# Patient Record
Sex: Female | Born: 1980 | Marital: Married
Health system: Midwestern US, Community
[De-identification: ages and names within clinical notes are randomized; demographics above are authoritative.]

## PROBLEM LIST (undated history)

## (undated) DIAGNOSIS — E119 Type 2 diabetes mellitus without complications: Principal | ICD-10-CM

## (undated) DIAGNOSIS — K219 Gastro-esophageal reflux disease without esophagitis: Secondary | ICD-10-CM

## (undated) HISTORY — DX: Type 2 diabetes mellitus without complications: E11.9

---

## 2009-07-07 ENCOUNTER — Emergency Department (HOSPITAL_COMMUNITY): Admission: EM | Admit: 2009-07-07 | Discharge: 2009-07-07 | Payer: Self-pay | Admitting: Family Medicine

## 2009-07-31 LAB — CHG HEMOGLOBIN ELECTROPHORESIS: Hemoglobin, Elp: NORMAL

## 2009-10-22 ENCOUNTER — Ambulatory Visit (HOSPITAL_COMMUNITY): Admission: RE | Admit: 2009-10-22 | Discharge: 2009-10-22 | Payer: Self-pay | Admitting: Family Medicine

## 2009-11-04 ENCOUNTER — Ambulatory Visit (HOSPITAL_COMMUNITY): Admission: RE | Admit: 2009-11-04 | Discharge: 2009-11-04 | Payer: Self-pay | Admitting: Family Medicine

## 2009-12-06 ENCOUNTER — Ambulatory Visit (HOSPITAL_COMMUNITY): Admission: RE | Admit: 2009-12-06 | Discharge: 2009-12-06 | Payer: Self-pay | Admitting: Family Medicine

## 2010-03-23 ENCOUNTER — Ambulatory Visit (HOSPITAL_COMMUNITY): Admission: RE | Admit: 2010-03-23 | Discharge: 2010-03-23 | Payer: Self-pay | Admitting: Family Medicine

## 2010-05-03 ENCOUNTER — Ambulatory Visit: Payer: Self-pay | Admitting: Family Medicine

## 2010-05-04 ENCOUNTER — Inpatient Hospital Stay (HOSPITAL_COMMUNITY): Admission: AD | Admit: 2010-05-04 | Discharge: 2010-05-04 | Payer: Self-pay | Admitting: Obstetrics & Gynecology

## 2010-05-08 ENCOUNTER — Inpatient Hospital Stay (HOSPITAL_COMMUNITY): Admission: AD | Admit: 2010-05-08 | Discharge: 2010-05-12 | Payer: Self-pay | Admitting: Family Medicine

## 2010-05-08 ENCOUNTER — Ambulatory Visit: Payer: Self-pay | Admitting: Obstetrics & Gynecology

## 2010-05-09 ENCOUNTER — Encounter: Payer: Self-pay | Admitting: Family Medicine

## 2010-08-28 ENCOUNTER — Emergency Department (HOSPITAL_COMMUNITY)
Admission: EM | Admit: 2010-08-28 | Discharge: 2010-08-28 | Payer: Self-pay | Source: Home / Self Care | Admitting: Emergency Medicine

## 2010-08-28 ENCOUNTER — Emergency Department (HOSPITAL_COMMUNITY)
Admission: EM | Admit: 2010-08-28 | Discharge: 2010-08-28 | Payer: Self-pay | Source: Home / Self Care | Admitting: Family Medicine

## 2010-11-04 ENCOUNTER — Emergency Department (HOSPITAL_COMMUNITY)
Admission: EM | Admit: 2010-11-04 | Discharge: 2010-11-04 | Payer: Self-pay | Source: Home / Self Care | Admitting: Family Medicine

## 2010-11-06 HISTORY — PX: CHOLECYSTECTOMY: SHX55

## 2010-12-08 ENCOUNTER — Emergency Department (HOSPITAL_COMMUNITY)
Admission: EM | Admit: 2010-12-08 | Discharge: 2010-12-08 | Disposition: A | Discharge status: Home / Self Care | Payer: Medicaid Other | Attending: Emergency Medicine | Admitting: Emergency Medicine

## 2010-12-08 DIAGNOSIS — H53149 Visual discomfort, unspecified: Secondary | ICD-10-CM | POA: Insufficient documentation

## 2010-12-08 DIAGNOSIS — R51 Headache: Secondary | ICD-10-CM | POA: Insufficient documentation

## 2010-12-21 IMAGING — US US OB COMP +14 WK
2 series · 14 of 28 positions shown · non-contrast
Comparison: none

OBSTETRICAL ULTRASOUND:
 This ultrasound exam was performed in the [HOSPITAL] Ultrasound Department.  The OB US report was generated in the AS system, and faxed to the ordering physician.  This report is also available in [HOSPITAL]?s AccessANYware and in [REDACTED] PACS.

[Series 1: us ob comp +14 wk · 14 acquisitions, 2 frames shown (1 of 2)]
[im 5/14]
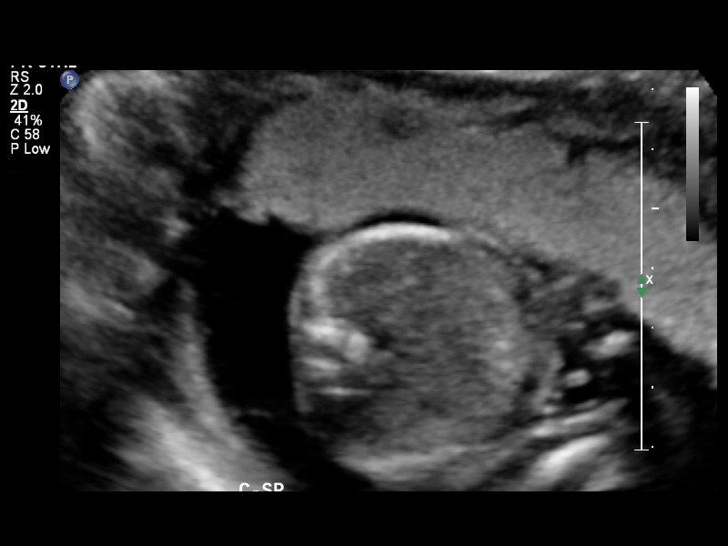
[im 14/14]
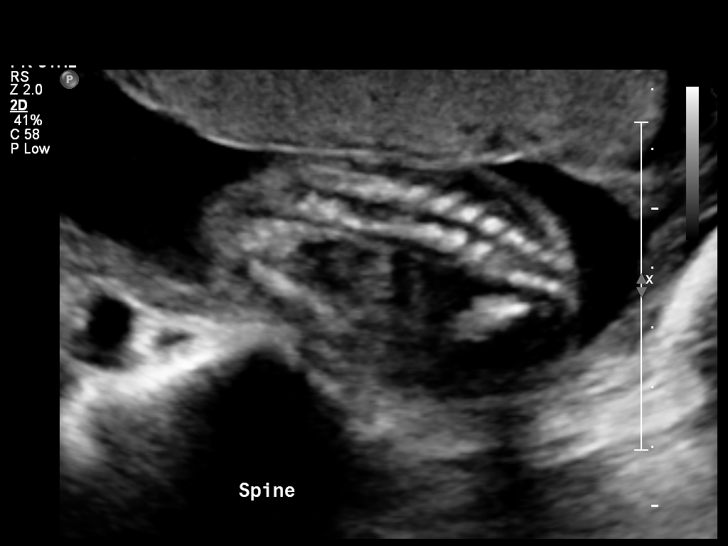

[Series 1: us ob comp +14 wk · 12 of 83 slices shown (2 of 2)]
[im 4/83]
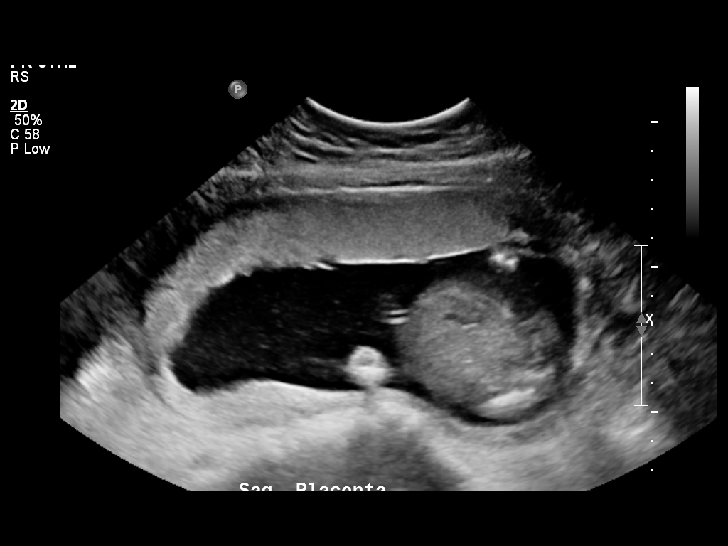
[im 11/83]
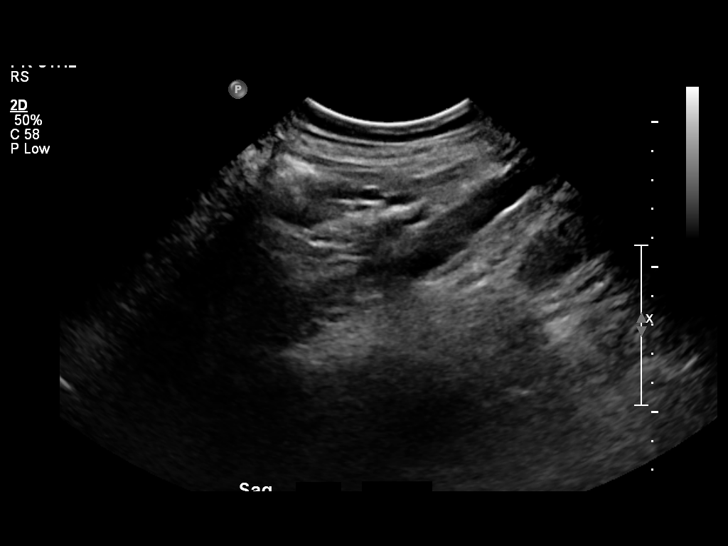
[im 18/83]
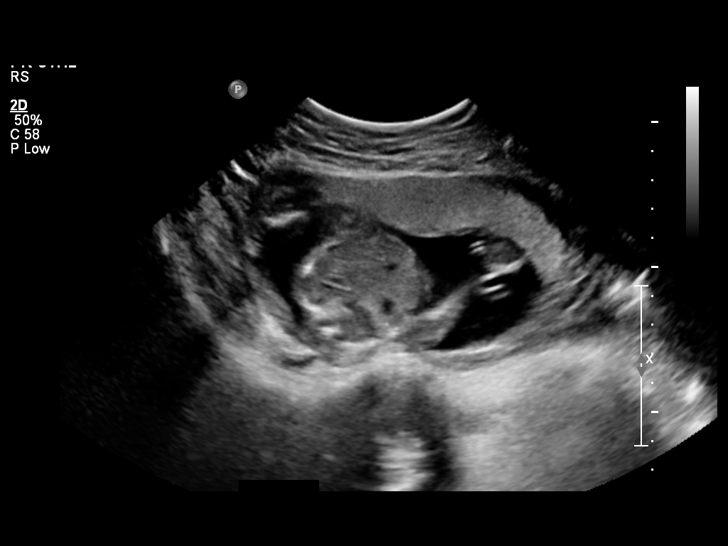
[im 25/83]
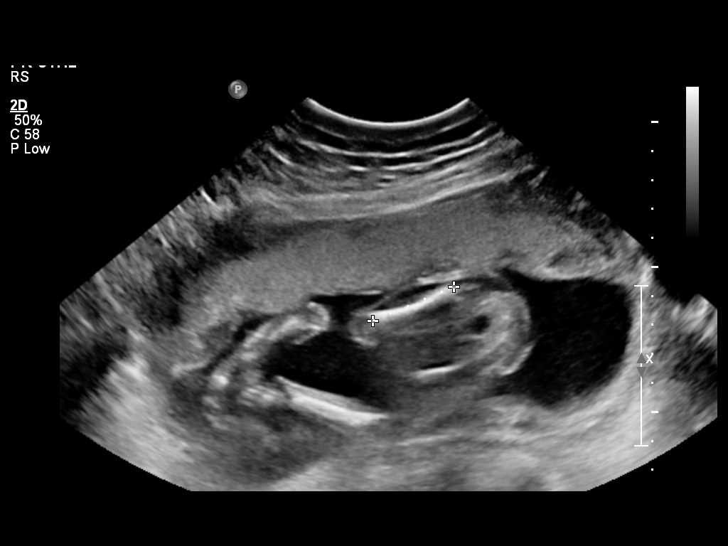
[im 33/83]
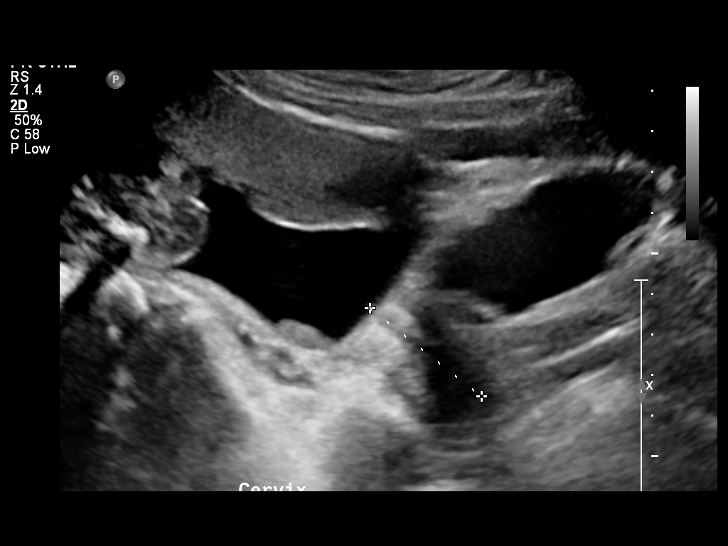
[im 40/83]
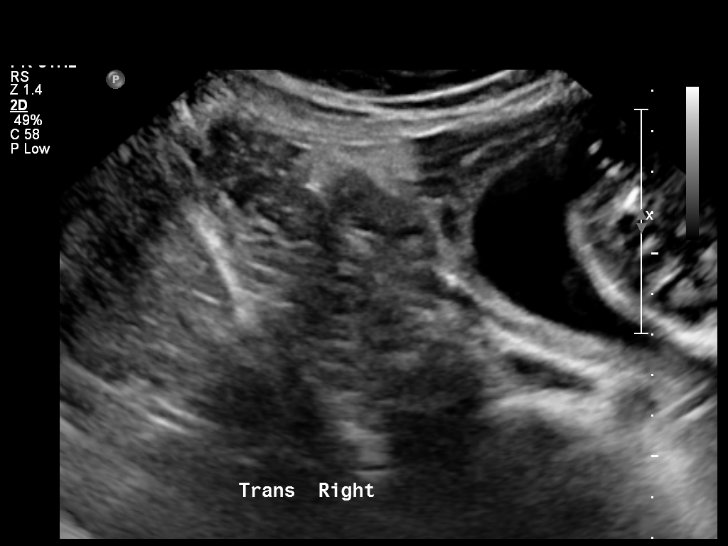
[im 47/83]
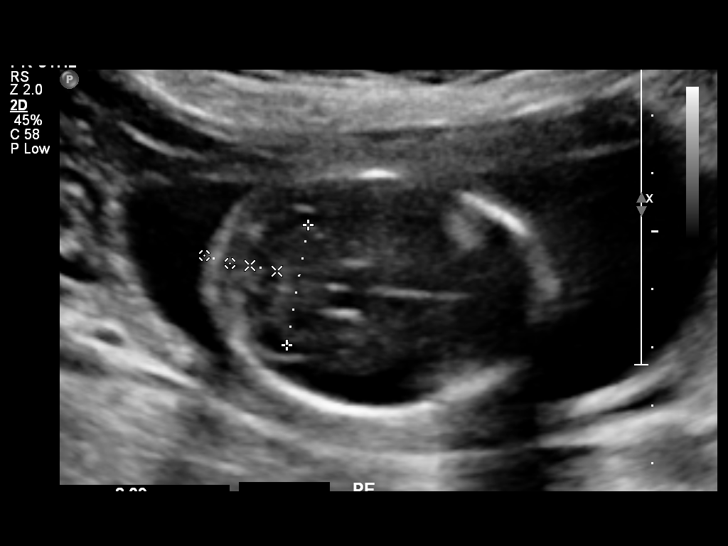
[im 54/83]
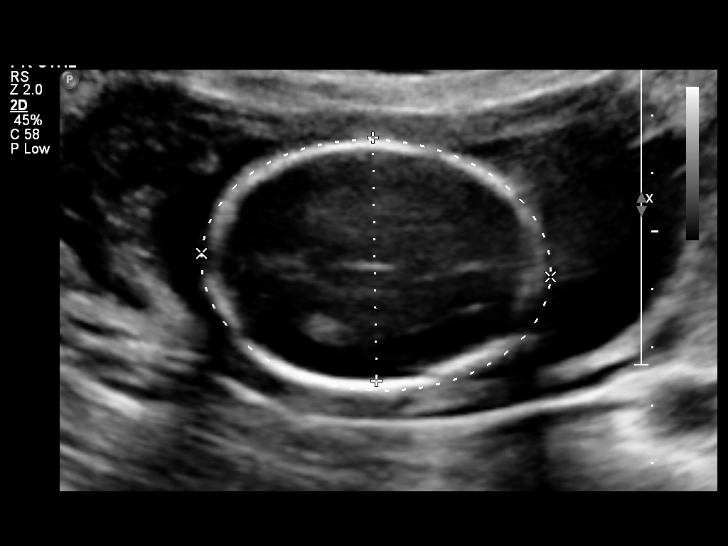
[im 61/83]
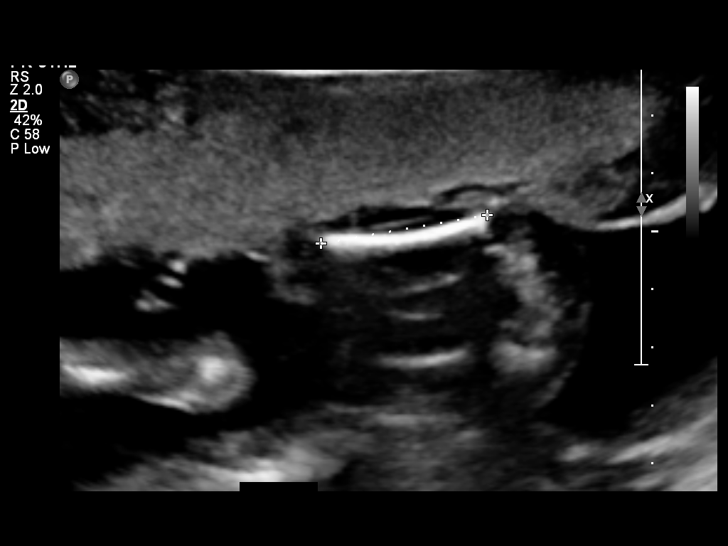
[im 68/83]
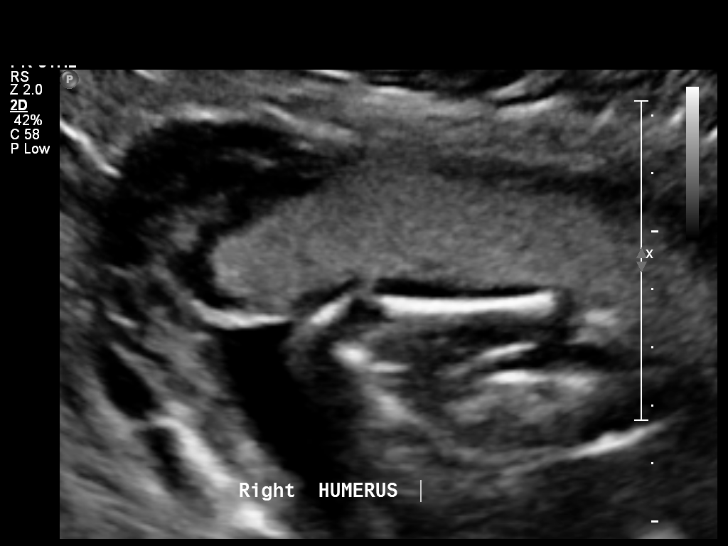
[im 75/83]
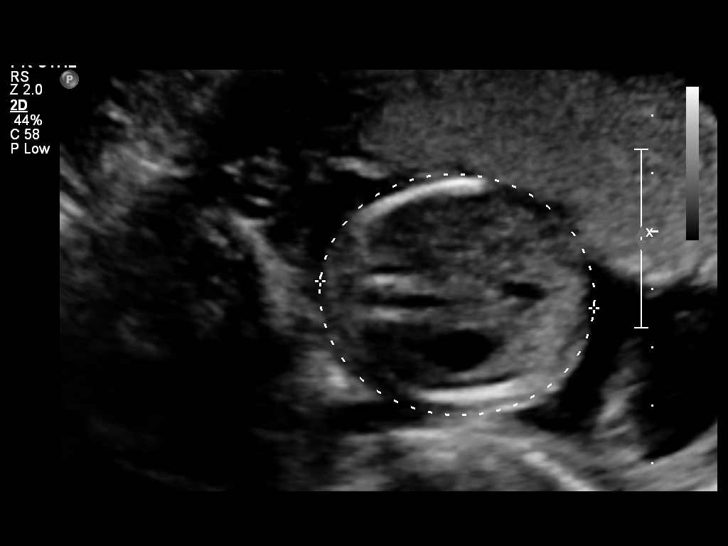
[im 83/83]
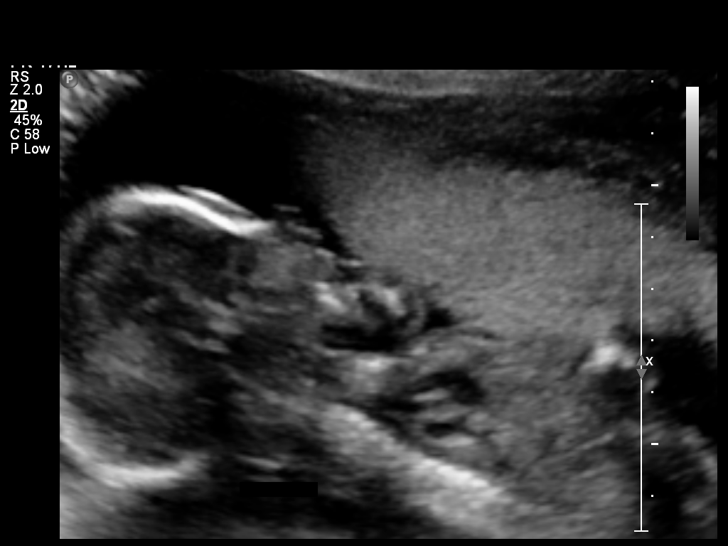

[14 of 28 positions shown; findings below may reference images not displayed]

IMPRESSION: See AS Obstetric US report.

## 2011-01-05 ENCOUNTER — Encounter (HOSPITAL_COMMUNITY)
Admission: RE | Admit: 2011-01-05 | Discharge: 2011-01-05 | Disposition: A | Discharge status: Home / Self Care | Payer: Medicaid Other | Source: Ambulatory Visit | Attending: Surgery | Admitting: Surgery

## 2011-01-05 DIAGNOSIS — Z01812 Encounter for preprocedural laboratory examination: Secondary | ICD-10-CM | POA: Insufficient documentation

## 2011-01-05 LAB — CBC
MCHC: 32.5 g/dL (ref 30.0–36.0)
RDW: 14.1 % (ref 11.5–15.5)

## 2011-01-05 LAB — HCG, SERUM, QUALITATIVE: Preg, Serum: NEGATIVE

## 2011-01-05 LAB — SURGICAL PCR SCREEN: Staphylococcus aureus: POSITIVE — AB

## 2011-01-09 ENCOUNTER — Ambulatory Visit (HOSPITAL_COMMUNITY): Payer: Medicaid Other

## 2011-01-09 ENCOUNTER — Other Ambulatory Visit: Payer: Self-pay | Admitting: Surgery

## 2011-01-09 ENCOUNTER — Ambulatory Visit (HOSPITAL_COMMUNITY)
Admission: RE | Admit: 2011-01-09 | Discharge: 2011-01-09 | Disposition: A | Discharge status: Home / Self Care | Payer: Medicaid Other | Source: Ambulatory Visit | Attending: Surgery | Admitting: Surgery

## 2011-01-09 DIAGNOSIS — K8010 Calculus of gallbladder with chronic cholecystitis without obstruction: Secondary | ICD-10-CM | POA: Insufficient documentation

## 2011-01-13 NOTE — Op Note (Signed)
NAMEABRISH, ERNY NO.:  [id number]  MEDICAL RECORD NO.:  [id number]           PATIENT TYPE:  O  LOCATION:  [facility]                         FACILITY:  [facility]  PHYSICIAN:  Wilmon Arms. Corliss Skains, M.D. DATE OF BIRTH:  February 06, 1981  DATE OF PROCEDURE:  01/09/2011 DATE OF DISCHARGE:  01/09/2011                              OPERATIVE REPORT   PREOPERATIVE DIAGNOSIS:  Chronic calculus cholecystitis.  POSTOPERATIVE DIAGNOSIS:  Chronic calculus cholecystitis.  PROCEDURE:  Laparoscopic cholecystectomy with intraoperative cholangiogram.  SURGEON:  Wilmon Arms. Corliss Skains, MD  ANESTHESIA:  General.  INDICATIONS:  This is a 30 year old female who presents with several months of intermittent right upper quadrant pain.  She had an ultrasound showing multiple gallstones.  She presents now for cholecystectomy.  DESCRIPTION OF PROCEDURE:  The patient was brought to the operating room, placed in supine position on the operating room table.  After an adequate level of general anesthesia was obtained, the patient's abdomen was prepped with ChloraPrep and draped in sterile fashion.  Time-out was taken to assure the proper patient, proper procedure.  We infiltrated the area below the umbilicus with 0.25% Marcaine with epinephrine.  A transverse incision was made.  Dissection was carried down to the fascia.  The fascia was opened vertically.  We entered the peritoneal cavity bluntly.  A stay suture of 0 Vicryl was placed around the fascial opening.  The Hasson cannula was inserted, secured to stay suture. Pneumoperitoneum was obtained by insufflating CO2, maintaining maximum pressure of 15 mmHg.  The laparoscope was inserted, and the patient was positioned in reverse Trendelenburg, tilted to her left.  An 11-mm port was placed in the subxiphoid position.  Two 5-mm ports were placed in right upper quadrant.  The gallbladder was grasped with clamp and lifted.  There were minimal adhesions to  the gallbladder.  We opened the peritoneum around the hilum of the gallbladder and bluntly dissected the cystic duct.  I could visualize the cystic artery and a large lymph node on the cystic artery.  The cystic duct was ligated with a clip distally. A small opening was created on the cystic duct.  A Cook cholangiogram catheter was inserted through a stab incision, threaded in the cystic duct.  A cholangiogram was then obtained which showed good flow proximally and distally to the biliary tree with no sign of filling defect.  Contrast flowed easily into the duodenum.  The catheter was removed and the cystic duct was ligated with clips and divided.  The cystic artery was ligated with clips and divided.  The gallbladder was dissected free from the liver using cautery.  The gallbladder fossa was thoroughly inspected for hemostasis.  A little bit of bile had been spilled but no stones were noted.  We irrigated thoroughly and suctioned out the irrigation.  The gallbladder was placed in the Endocatch sac and removed through the umbilical port site.  We again thoroughly irrigated. The umbilical fascia was closed with the pursestring suture. Pneumoperitoneum was released and removed the trocars.  A 4-0 Monocryl was used to close the skin incisions.  Steri-Strips and clean dressings were applied.  The patient was extubated  and brought to recovery in stable condition.  All sponge, instrument, needle counts correct.     Wilmon Arms. Corliss Skains, M.D.     MKT/MEDQ  D:  01/09/2011  T:  01/09/2011  Job:  [id number]  Electronically Signed by Manus Rudd M.D. on 01/13/2011 10:18:10 PM

## 2011-01-18 LAB — CBC
HCT: 42.4 % (ref 36.0–46.0)
MCHC: 33 g/dL (ref 30.0–36.0)
Platelets: 244 10*3/uL (ref 150–400)
RDW: 13.8 % (ref 11.5–15.5)
WBC: 9.6 10*3/uL (ref 4.0–10.5)

## 2011-01-18 LAB — POCT URINALYSIS DIPSTICK
Ketones, ur: NEGATIVE mg/dL
Nitrite: NEGATIVE
Protein, ur: NEGATIVE mg/dL
pH: 5 (ref 5.0–8.0)

## 2011-01-18 LAB — COMPREHENSIVE METABOLIC PANEL
Alkaline Phosphatase: 107 U/L (ref 39–117)
BUN: 10 mg/dL (ref 6–23)
CO2: 22 mEq/L (ref 19–32)
GFR calc non Af Amer: >60 mL/min (ref >60)
Glucose, Bld: 92 mg/dL (ref 70–99)
Potassium: 3.7 mEq/L (ref 3.5–5.1)
Total Protein: 8.4 g/dL — ABNORMAL HIGH (ref 6.0–8.3)

## 2011-01-18 LAB — LIPASE, BLOOD: Lipase: 29 U/L (ref 11–59)

## 2011-01-18 LAB — DIFFERENTIAL
Basophils Absolute: 0 10*3/uL (ref 0.0–0.1)
Basophils Relative: 0 % (ref 0–1)
Lymphocytes Relative: 29 % (ref 12–46)
Neutro Abs: 6.2 10*3/uL (ref 1.7–7.7)
Neutrophils Relative %: 65 % (ref 43–77)

## 2011-01-22 LAB — CBC
HCT: 32.2 % — ABNORMAL LOW (ref 36.0–46.0)
HCT: 36.3 % (ref 36.0–46.0)
Hemoglobin: 11.8 g/dL — ABNORMAL LOW (ref 12.0–15.0)
MCV: 81.7 fL (ref 78.0–100.0)
RBC: 3.94 MIL/uL (ref 3.87–5.11)
RBC: 4.52 MIL/uL (ref 3.87–5.11)
RDW: 14.6 % (ref 11.5–15.5)
WBC: 14.4 10*3/uL — ABNORMAL HIGH (ref 4.0–10.5)
WBC: 7.6 10*3/uL (ref 4.0–10.5)

## 2011-01-22 LAB — RPR: RPR Ser Ql: NONREACTIVE

## 2011-02-10 LAB — POCT PREGNANCY, URINE: Preg Test, Ur: NEGATIVE

## 2011-02-10 LAB — POCT URINALYSIS DIP (DEVICE)
Bilirubin Urine: NEGATIVE
Ketones, ur: NEGATIVE mg/dL
Protein, ur: NEGATIVE mg/dL
Specific Gravity, Urine: >=1.03 (ref 1.005–1.030)
pH: 5 (ref 5.0–8.0)

## 2011-06-30 LAB — CYTOLOGY - PAP: Pap Smear: NEGATIVE

## 2012-01-24 IMAGING — RF DG CHOLANGIOGRAM OPERATIVE
1 series · 4 of 4 positions shown · non-contrast
Comparison: Abdominal ultrasound dated 08/28/2010

CLINICAL DATA: Cholecystitis.

INTRAOPERATIVE CHOLANGIOGRAM
TECHNIQUE: Cholangiographic images from the C-arm fluoroscopic
device were submitted for interpretation post-operatively.  Please
see the procedural report for the amount of contrast and the
fluoroscopy time utilized.

[Series 1: run · 4 of 41 frames shown]
[frame 2/41]
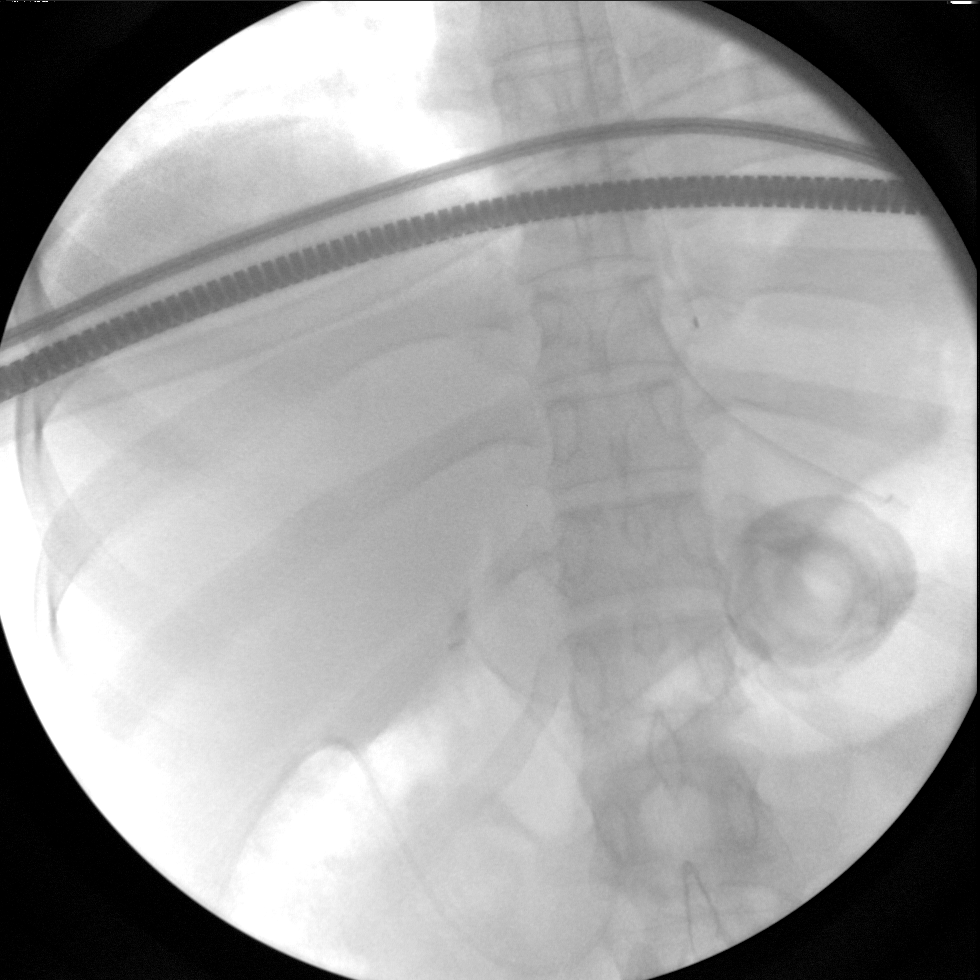
[frame 7/41]
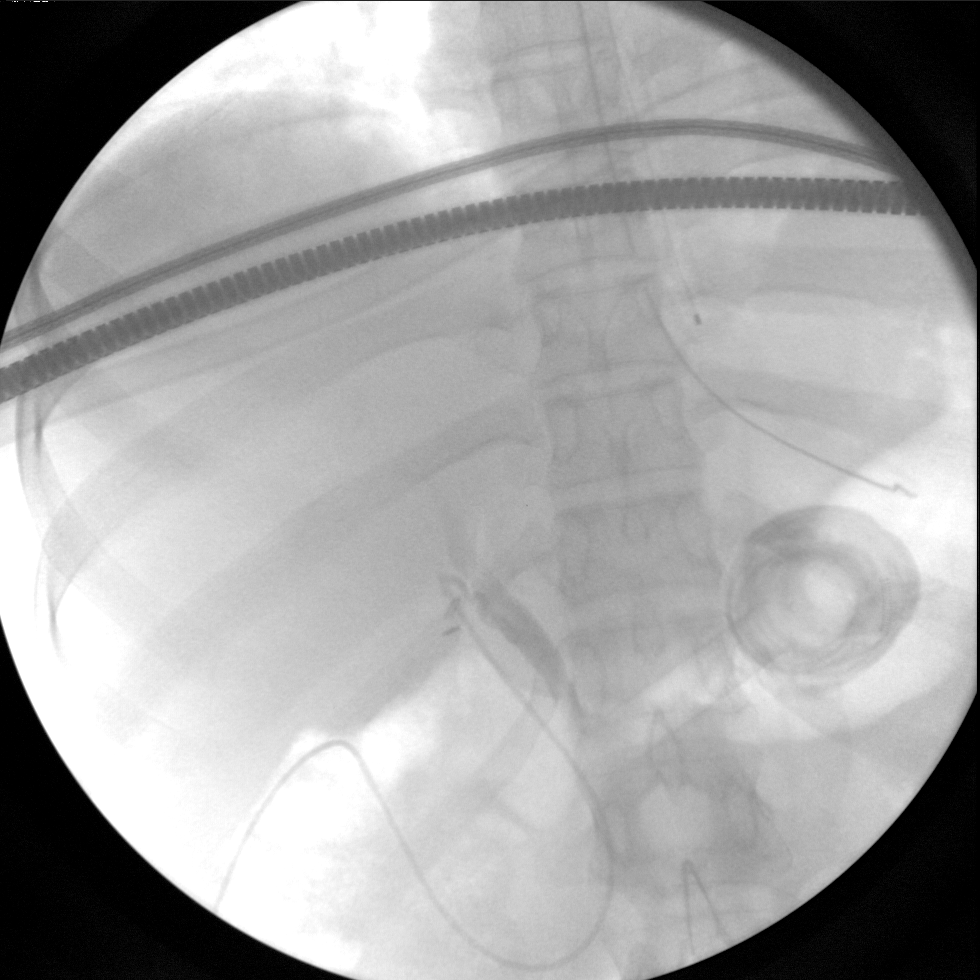
[frame 21/41]
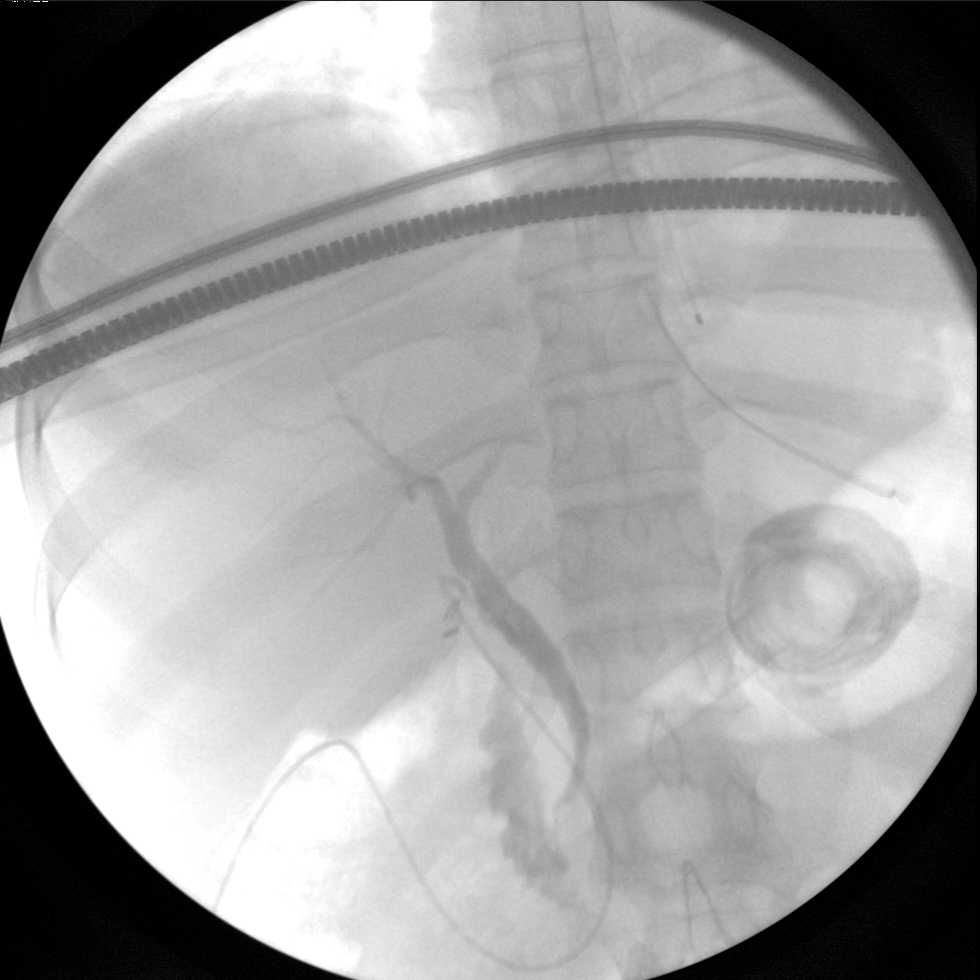
[frame 35/41]
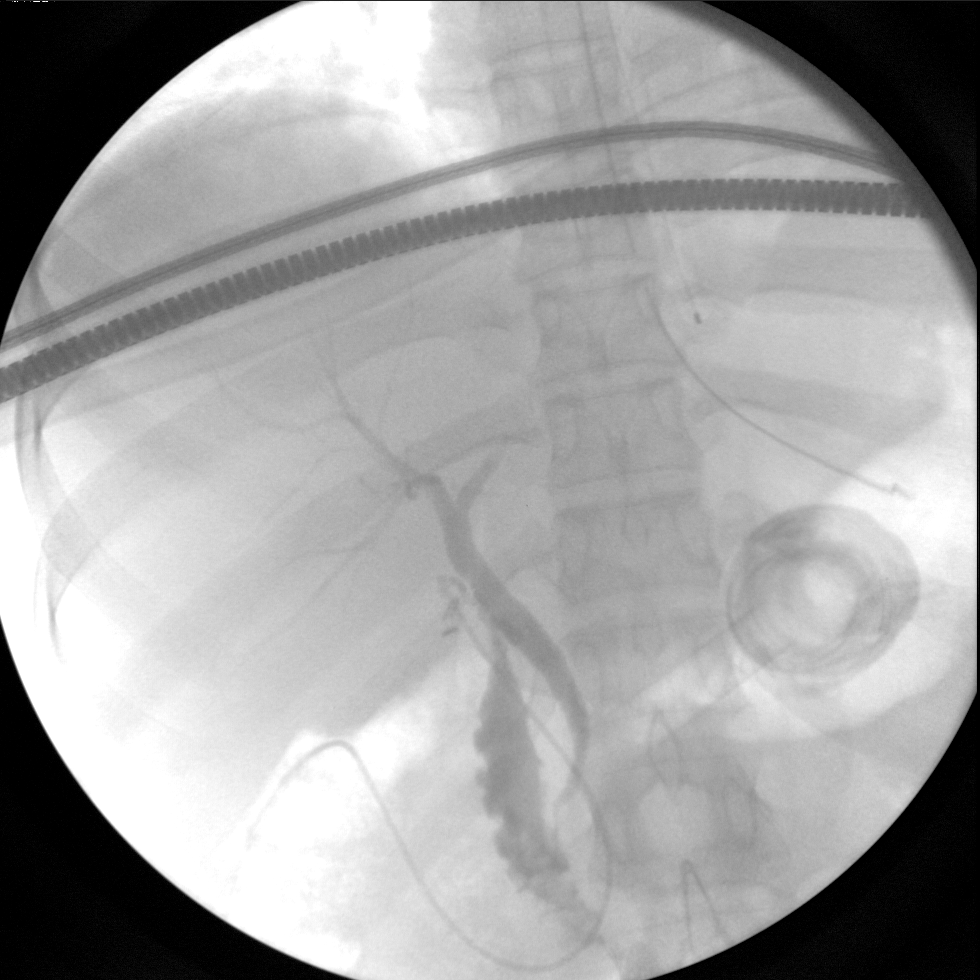

[4 of 4 positions shown; findings below may reference images not displayed]

FINDINGS: There is a nasogastric tube in the stomach body region.
There is an additional tube near the GE junction.  Cannulization of
the cystic duct.  Opacification of the common bile duct with reflux
into the intrahepatic biliary system.  Contrast drains into the
duodenum.  There may be a small amount of sludge near the cystic
duct but no evidence for obstructing stones.
IMPRESSION: Patent biliary system.  No evidence for large stones.  Difficult to
exclude nonobstructive sludge.

## 2014-03-22 ENCOUNTER — Emergency Department (HOSPITAL_COMMUNITY)
Admission: EM | Admit: 2014-03-22 | Discharge: 2014-03-22 | Discharge status: Absent without leave | Payer: Self-pay | Source: Home / Self Care

## 2014-05-07 ENCOUNTER — Other Ambulatory Visit (HOSPITAL_COMMUNITY): Payer: Self-pay | Admitting: Physician Assistant

## 2014-05-07 DIAGNOSIS — Z3682 Encounter for antenatal screening for nuchal translucency: Secondary | ICD-10-CM

## 2014-05-07 LAB — OB RESULTS CONSOLE HGB/HCT, BLOOD
HCT: 38 %
HEMOGLOBIN: 12.3 g/dL
HEMOGLOBIN: 12.3 g/dL
HEMOGLOBIN: 12.3 g/dL

## 2014-05-07 LAB — OB RESULTS CONSOLE PLATELET COUNT: Platelets: 239 10*3/uL

## 2014-05-07 LAB — OB RESULTS CONSOLE ABO/RH: RH TYPE: POSITIVE

## 2014-05-07 LAB — GLUCOSE TOLERANCE, 1 HOUR
Glucose, 1 Hour GTT: 171
Glucose, GTT - 1 Hour: 171 mg/dL (ref ?–200)

## 2014-05-07 LAB — CULTURE, OB URINE: Urine Culture, OB: NEGATIVE

## 2014-05-07 LAB — OB RESULTS CONSOLE ANTIBODY SCREEN: Antibody Screen: NEGATIVE

## 2014-05-11 LAB — OB RESULTS CONSOLE HEPATITIS B SURFACE ANTIGEN: HEP B S AG: NEGATIVE

## 2014-05-11 LAB — GLUCOSE TOLERANCE, 3 HOURS
GLUCOSE FASTING: 117 mg/dL — AB (ref 60–109)
GLUCOSE: 259
Glucose: 215
Glucose: 265

## 2014-05-11 LAB — GLUCOSE TOLERANCE, 1 HOUR
GLUCOSE 1 HOUR GTT: 259 mg/dL — AB (ref ?–200)
Glucose, Fasting: 117 mg/dL — AB (ref 60–109)
Glucose, GTT - 2 Hour: 265 mg/dL — AB (ref ?–140)
Glucose, GTT - 3 Hour: 215 mg/dL — AB (ref ?–140)

## 2014-05-11 LAB — OB RESULTS CONSOLE RPR: RPR: NONREACTIVE

## 2014-05-11 LAB — OB RESULTS CONSOLE HIV ANTIBODY (ROUTINE TESTING): HIV: NONREACTIVE

## 2014-05-11 LAB — OB RESULTS CONSOLE RUBELLA ANTIBODY, IGM: RUBELLA: IMMUNE

## 2014-05-13 DIAGNOSIS — O34219 Maternal care for unspecified type scar from previous cesarean delivery: Secondary | ICD-10-CM | POA: Insufficient documentation

## 2014-05-13 DIAGNOSIS — O24419 Gestational diabetes mellitus in pregnancy, unspecified control: Secondary | ICD-10-CM | POA: Insufficient documentation

## 2014-05-13 NOTE — Progress Notes (Signed)
Transfer from [facility]. Unsure LMP 02/11/14

## 2014-05-14 ENCOUNTER — Encounter: Payer: Self-pay | Admitting: *Deleted

## 2014-05-18 ENCOUNTER — Other Ambulatory Visit: Payer: Self-pay

## 2014-05-18 ENCOUNTER — Ambulatory Visit (HOSPITAL_COMMUNITY)
Admission: RE | Admit: 2014-05-18 | Discharge: 2014-05-18 | Disposition: A | Discharge status: Home / Self Care | Payer: BC Managed Care – PPO | Source: Ambulatory Visit | Attending: Physician Assistant | Admitting: Physician Assistant

## 2014-05-18 ENCOUNTER — Ambulatory Visit (INDEPENDENT_AMBULATORY_CARE_PROVIDER_SITE_OTHER): Payer: BC Managed Care – PPO | Admitting: Obstetrics & Gynecology

## 2014-05-18 ENCOUNTER — Encounter: Payer: BC Managed Care – PPO | Attending: Obstetrics & Gynecology | Admitting: *Deleted

## 2014-05-18 ENCOUNTER — Ambulatory Visit (HOSPITAL_COMMUNITY)
Admission: RE | Admit: 2014-05-18 | Discharge: 2014-05-18 | Disposition: A | Discharge status: Home / Self Care | Payer: BC Managed Care – PPO | Source: Ambulatory Visit | Attending: Surgery | Admitting: Surgery

## 2014-05-18 VITALS — Ht 59.0 in | Wt 158.7 lb

## 2014-05-18 DIAGNOSIS — O99810 Abnormal glucose complicating pregnancy: Secondary | ICD-10-CM | POA: Diagnosis present

## 2014-05-18 DIAGNOSIS — Z3682 Encounter for antenatal screening for nuchal translucency: Secondary | ICD-10-CM

## 2014-05-18 DIAGNOSIS — Z36 Encounter for antenatal screening of mother: Secondary | ICD-10-CM | POA: Diagnosis present

## 2014-05-18 DIAGNOSIS — O34219 Maternal care for unspecified type scar from previous cesarean delivery: Secondary | ICD-10-CM

## 2014-05-18 NOTE — Progress Notes (Signed)
  Patient was seen on 05/18/14 for Gestational Diabetes self-management . The following learning objectives were met by the patient :   States the definition of Gestational Diabetes  States when to check blood glucose levels  Demonstrates proper blood glucose monitoring techniques  States the effect of stress and exercise on blood glucose levels  Plan:  Consider  increasing your activity level by walking daily as tolerated Begin checking BG before breakfast and-2 hours after first bit of breakfast, lunch and dinner after  as directed by MD   Blood glucose monitor given: TrueTrack Lot # [id number]  Exp: 2016/03/19 Blood glucose reading: $RemoveBeforeDE'190mg'haPzNiDsPZJzjQA$ /dl  Patient instructed to monitor glucose levels: FBS: 60 - <90 2 hour: <120  Patient received the following handouts:  Nutrition Diabetes and Pregnancy  Patient will be seen for follow-up as needed.

## 2014-05-18 NOTE — Progress Notes (Signed)
Patient was seen today for GDM diet education.  Obese woman pregravid.  7.7# gain at 8096w5d is above recommendations.  Pt given written and verbal GDM education.  She reports eating 3 meals/day, no snacks.  NKFA.  No N/V reported.  Taking PNV.  No meds.  Discussed wt gain goal of 11-20#, or 1/2# per week for remainder of pregnancy, and incorporating physical activity into daily routine.  Pt agrees to follow GDM diet including 3 meals + 3 snacks/day and proper carbohydrate/protein combination.  [facility]  Follow up in 4-6 weeks.  Melanee LeftErin Cashwell, MPH RD LDN

## 2014-05-25 ENCOUNTER — Encounter: Payer: Self-pay | Admitting: Obstetrics & Gynecology

## 2014-05-25 ENCOUNTER — Ambulatory Visit (INDEPENDENT_AMBULATORY_CARE_PROVIDER_SITE_OTHER): Payer: BC Managed Care – PPO | Admitting: Obstetrics & Gynecology

## 2014-05-25 VITALS — BP 95/56 | HR 87 | Temp 98.3°F | Wt 159.1 lb

## 2014-05-25 DIAGNOSIS — O24414 Gestational diabetes mellitus in pregnancy, insulin controlled: Secondary | ICD-10-CM

## 2014-05-25 DIAGNOSIS — O99810 Abnormal glucose complicating pregnancy: Secondary | ICD-10-CM

## 2014-05-25 DIAGNOSIS — O34219 Maternal care for unspecified type scar from previous cesarean delivery: Secondary | ICD-10-CM

## 2014-05-25 DIAGNOSIS — O24912 Unspecified diabetes mellitus in pregnancy, second trimester: Secondary | ICD-10-CM

## 2014-05-25 DIAGNOSIS — O0992 Supervision of high risk pregnancy, unspecified, second trimester: Secondary | ICD-10-CM

## 2014-05-25 DIAGNOSIS — E119 Type 2 diabetes mellitus without complications: Secondary | ICD-10-CM

## 2014-05-25 DIAGNOSIS — O24919 Unspecified diabetes mellitus in pregnancy, unspecified trimester: Secondary | ICD-10-CM

## 2014-05-25 DIAGNOSIS — O0990 Supervision of high risk pregnancy, unspecified, unspecified trimester: Secondary | ICD-10-CM

## 2014-05-25 LAB — POCT URINALYSIS DIP (DEVICE)
Bilirubin Urine: NEGATIVE
Glucose, UA: NEGATIVE mg/dL
HGB URINE DIPSTICK: NEGATIVE
Ketones, ur: NEGATIVE mg/dL
Leukocytes, UA: NEGATIVE
NITRITE: NEGATIVE
PROTEIN: NEGATIVE mg/dL
Specific Gravity, Urine: 1.01 (ref 1.005–1.030)
UROBILINOGEN UA: 0.2 mg/dL (ref 0.0–1.0)
pH: 6.5 (ref 5.0–8.0)

## 2014-05-25 MED ORDER — GLYBURIDE 2.5 MG PO TABS: 2.5000 mg | ORAL_TABLET | Freq: Two times a day (BID) | ORAL | Status: DC

## 2014-05-25 NOTE — Progress Notes (Signed)
New OB packet given

## 2014-05-25 NOTE — Progress Notes (Signed)
Fastings:  115-138;  Pp break 128-220;  Pp lunch 137-216;  Pp dinner 165-200.  Pt states she is following GDM diet.  Start glyburide 2.5 mg bid    Will have her met with nutritionist again.  Nuchal 3.0.  Pt got panorama.  Needs AFP at appropriate gestational age.  Needs fetal echo and baseline labs.  Need records from health dept.

## 2014-05-25 NOTE — Progress Notes (Signed)
Nutrition note: GDM diet review Pt has gained 4.1# @ 7854w5d, which is wnl. Pt lost a little wt since last appt but pt also made some dietary changes such as cutting out soda & decreasing portion sizes, which could have caused wt loss. Pt has been checking her BS- fasting: 114-138; 2hr pp: [phone]. Per Dr Penne LashLeggett- planning to start meds to help control BS. Pt reports eating 3 meals & 1-2 snacks/d. Pt is taking PNV. Pt reports walking 1hr/d (30 mins in am & 30 mins in pm). Reviewed GDM diet & provided DM My Plate handout. Discussed portion sizes & encouraged pt to buy some measuring cups. Also discussed food labels.  Discussed wt gain goals of 11-20# or 0.5#/wk. Pt agrees to monitor her portion sizes. F/u in 4-6 wks Elizabeth RevealLaura Kory Rains, MS, RD, LDN, [facility]

## 2014-05-26 ENCOUNTER — Telehealth: Payer: Self-pay

## 2014-05-26 ENCOUNTER — Other Ambulatory Visit: Payer: BC Managed Care – PPO

## 2014-05-26 DIAGNOSIS — R8290 Unspecified abnormal findings in urine: Secondary | ICD-10-CM

## 2014-05-26 LAB — COMPREHENSIVE METABOLIC PANEL
ALBUMIN: 3.4 g/dL — AB (ref 3.5–5.2)
ALK PHOS: 48 U/L (ref 39–117)
ALT: 9 U/L (ref 0–35)
AST: 11 U/L (ref 0–37)
BILIRUBIN TOTAL: 0.4 mg/dL (ref 0.2–1.2)
BUN: 5 mg/dL — AB (ref 6–23)
CO2: 21 mEq/L (ref 19–32)
CREATININE: 0.52 mg/dL (ref 0.50–1.10)
Calcium: 8.8 mg/dL (ref 8.4–10.5)
Chloride: 103 mEq/L (ref 96–112)
GLUCOSE: 138 mg/dL — AB (ref 70–99)
POTASSIUM: 4 meq/L (ref 3.5–5.3)
Sodium: 135 mEq/L (ref 135–145)
Total Protein: 6.5 g/dL (ref 6.0–8.3)

## 2014-05-26 NOTE — Telephone Encounter (Addendum)
[facility] and requested she fax over H&P and all lab work-- she stated she would work on it this afternoon. Called [facility] and spoke to WaterproofAngela, who states nuchal U/S was read, however, since the beginning of the month U/S reports will not be scanned in automatically-- they will be faxed to office and need to be scanned in via our staff.   Update: [facility]ed and to be scanned in. Looked through records and did not see U/S results to scan in. Called [facility] again-- Marylene Landngela to have sonographer retrieve U/S report and fax it to clinic.

## 2014-05-26 NOTE — Telephone Encounter (Signed)
Message copied by Louanna RawAMPBELL, Keiry Kowal M on Tue May 26, 2014  3:02 PM ------      Message from: Odelia GageLINTON, CHERYL A      Created: Mon May 25, 2014  3:55 PM                   ----- Message -----         From: Lesly DukesKelly H Leggett, MD         Sent: 05/25/2014   9:19 AM           To: [facility] Admin Pool            Please get records from health dept with H & P and rest of labs.      Please call [facility] and have them read the Nuchal US. ------

## 2014-05-26 NOTE — Telephone Encounter (Signed)
Received nuchal U/S from [facility]. Will be scanned in.

## 2014-05-27 ENCOUNTER — Encounter: Payer: Self-pay | Admitting: *Deleted

## 2014-05-27 LAB — TSH: TSH: 1.509 u[IU]/mL (ref 0.350–4.500)

## 2014-05-28 ENCOUNTER — Telehealth (HOSPITAL_COMMUNITY): Payer: Self-pay | Admitting: MS"

## 2014-05-28 NOTE — Telephone Encounter (Signed)
Called Elizabeth West to discuss her cell free fetal DNA test results.  Mrs. Elizabeth West had Panorama testing through [facility].  Testing was offered because of increased nuchal translucency on ultrasound.   The patient was identified by name and DOB.  We reviewed that these are within normal limits, showing a less than 1 in 10,000 risk for trisomies 21, 18 and 13, and monosomy X (Turner syndrome).  In addition, the risk for triploidy/vanishing twin and sex chromosome trisomies (47,XXX and 47,XXY) was also low risk.  We reviewed that this testing identifies > 99% of pregnancies with trisomy 6321, trisomy 4113, sex chromosome trisomies (47,XXX and 47,XXY), and triploidy. The detection rate for trisomy 18 is 96%.  The detection rate for monosomy X is ~92%.  The false positive rate is <0.1% for all conditions. Testing was also consistent with female fetal sex.  The patient did wish to know fetal sex.  This testing does not identify all genetic conditions.   Detailed ultrasound and fetal echocardiogram are available to the patient in the second trimester given the increased nuchal translucency finding.  All questions were answered to her satisfaction, she was encouraged to call with additional questions or concerns.  Quinn PlowmanKaren Jaymar Loeber, MS Patent attorneyCertified Genetic Counselor

## 2014-05-29 LAB — PROTEIN, URINE, 24 HOUR: Protein, Urine: <3 mg/dL

## 2014-05-29 LAB — CREATININE CLEARANCE, URINE, 24 HOUR
CREAT CLEAR: 170 mL/min — AB (ref 75–115)
Creatinine, 24H Ur: 1272 mg/d (ref 700–1800)
Creatinine, Urine: 42.8 mg/dL
Creatinine: 0.52 mg/dL (ref 0.50–1.10)

## 2014-06-01 ENCOUNTER — Ambulatory Visit (INDEPENDENT_AMBULATORY_CARE_PROVIDER_SITE_OTHER): Payer: BC Managed Care – PPO | Admitting: Family Medicine

## 2014-06-01 ENCOUNTER — Encounter: Payer: Self-pay | Admitting: Family Medicine

## 2014-06-01 VITALS — BP 108/62 | HR 78 | Temp 98.2°F | Wt 158.9 lb

## 2014-06-01 DIAGNOSIS — O0992 Supervision of high risk pregnancy, unspecified, second trimester: Secondary | ICD-10-CM

## 2014-06-01 DIAGNOSIS — O0990 Supervision of high risk pregnancy, unspecified, unspecified trimester: Secondary | ICD-10-CM

## 2014-06-01 DIAGNOSIS — E119 Type 2 diabetes mellitus without complications: Secondary | ICD-10-CM

## 2014-06-01 DIAGNOSIS — O24912 Unspecified diabetes mellitus in pregnancy, second trimester: Secondary | ICD-10-CM

## 2014-06-01 DIAGNOSIS — O289 Unspecified abnormal findings on antenatal screening of mother: Secondary | ICD-10-CM

## 2014-06-01 DIAGNOSIS — O24919 Unspecified diabetes mellitus in pregnancy, unspecified trimester: Secondary | ICD-10-CM

## 2014-06-01 DIAGNOSIS — O99810 Abnormal glucose complicating pregnancy: Secondary | ICD-10-CM

## 2014-06-01 DIAGNOSIS — O283 Abnormal ultrasonic finding on antenatal screening of mother: Secondary | ICD-10-CM | POA: Insufficient documentation

## 2014-06-01 LAB — POCT URINALYSIS DIP (DEVICE)
Bilirubin Urine: NEGATIVE
GLUCOSE, UA: NEGATIVE mg/dL
HGB URINE DIPSTICK: NEGATIVE
Ketones, ur: NEGATIVE mg/dL
Leukocytes, UA: NEGATIVE
NITRITE: NEGATIVE
Protein, ur: NEGATIVE mg/dL
UROBILINOGEN UA: 0.2 mg/dL (ref 0.0–1.0)
pH: 5.5 (ref 5.0–8.0)

## 2014-06-01 MED ORDER — GLYBURIDE 2.5 MG PO TABS: 2.5000 mg | ORAL_TABLET | Freq: Two times a day (BID) | ORAL | Status: DC

## 2014-06-01 MED ORDER — ASPIRIN 81 MG PO CHEW: 81.0000 mg | CHEWABLE_TABLET | Freq: Every day | ORAL | Status: DC

## 2014-06-01 NOTE — Progress Notes (Signed)
Blood sugar log reviewed Fasting BS 71-114 (2 out of range) 2 hr pp 106-250 (15 of of 18 out of range) On glyburide 2.5 mg bid--fastings ok--will increase to 5 mg q am

## 2014-06-01 NOTE — Progress Notes (Signed)
Nutrition note: GDM diet f/u Pt's BS since last week- fasting: 71-104; 2hr pp: 106-250 Pt reports she thinks that the reason her BS were high after dinner last night is because she had fried fish. Discussed how the breading/ dough on the fried fish has some CHO but the pt also stated she had >1c of rice with her dinner (discussed how this is probably reason for elevated BS).  Reviewed portion sizes. Pt stated she has not bought any measuring cups yet but plans to this week.  Pt asked about barley soup that she's been making & stated the label said it had 11g CHO/ serving but forgot to look @ what a serving is. Reviewed 15g CHO= 1 CHO portion. Also reviewed 2 CHO portions with breakfast & snacks and 3 CHO portions with lunch & dinner. F/u in 2-4 wks Blondell RevealLaura Edword Cu, MS, RD, LDN, [facility]

## 2014-06-01 NOTE — Progress Notes (Signed)
[facility] 1115

## 2014-06-01 NOTE — Patient Instructions (Signed)
Gestational Diabetes Mellitus Gestational diabetes mellitus, often simply referred to as gestational diabetes, is a type of diabetes that some women develop during pregnancy. In gestational diabetes, the pancreas does not make enough insulin (a hormone), the cells are less responsive to the insulin that is made (insulin resistance), or both.Normally, insulin moves sugars from food into the tissue cells. The tissue cells use the sugars for energy. The lack of insulin or the lack of normal response to insulin causes excess sugars to build up in the blood instead of going into the tissue cells. As a result, high blood sugar (hyperglycemia) develops. The effect of high sugar (glucose) levels can cause many problems.  RISK FACTORS You have an increased chance of developing gestational diabetes if you have a family history of diabetes and also have one or more of the following risk factors:  A body mass index over 30 (obesity).  A previous pregnancy with gestational diabetes.  An older age at the time of pregnancy. If blood glucose levels are kept in the normal range during pregnancy, women can have a healthy pregnancy. If your blood glucose levels are not well controlled, there may be risks to you, your unborn baby (fetus), your labor and delivery, or your newborn baby.  SYMPTOMS  If symptoms are experienced, they are much like symptoms you would normally expect during pregnancy. The symptoms of gestational diabetes include:   Increased thirst (polydipsia).  Increased urination (polyuria).  Increased urination during the night (nocturia).  Weight loss. This weight loss may be rapid.  Frequent, recurring infections.  Tiredness (fatigue).  Weakness.  Vision changes, such as blurred vision.  Fruity smell to your breath.  Abdominal pain. DIAGNOSIS Diabetes is diagnosed when blood glucose levels are increased. Your blood glucose level may be checked by one or more of the following blood  tests:  A fasting blood glucose test. You will not be allowed to eat for at least 8 hours before a blood sample is taken.  A random blood glucose test. Your blood glucose is checked at any time of the day regardless of when you ate.  A hemoglobin A1c blood glucose test. A hemoglobin A1c test provides information about blood glucose control over the previous 3 months.  An oral glucose tolerance test (OGTT). Your blood glucose is measured after you have not eaten (fasted) for 1-3 hours and then after you drink a glucose-containing beverage. Since the hormones that cause insulin resistance are highest at about 24-28 weeks of a pregnancy, an OGTT is usually performed during that time. If you have risk factors for gestational diabetes, your health care provider may test you for gestational diabetes earlier than 24 weeks of pregnancy. TREATMENT   You will need to take diabetes medicine or insulin daily to keep blood glucose levels in the desired range.  You will need to match insulin dosing with exercise and healthy food choices. The treatment goal is to maintain the before-meal (preprandial), bedtime, and overnight blood glucose level at 60-99 mg/dL during pregnancy. The treatment goal is to further maintain peak after-meal blood sugar (postprandial glucose) level at 100-140 mg/dL. HOME CARE INSTRUCTIONS   Have your hemoglobin A1c level checked twice a year.  Perform daily blood glucose monitoring as directed by your health care provider. It is common to perform frequent blood glucose monitoring.  Monitor urine ketones when you are ill and as directed by your health care provider.  Take your diabetes medicine and insulin as directed by your health care provider   to maintain your blood glucose level in the desired range.  Never run out of diabetes medicine or insulin. It is needed every day.  Adjust insulin based on your intake of carbohydrates. Carbohydrates can raise blood glucose levels but  need to be included in your diet. Carbohydrates provide vitamins, minerals, and fiber which are an essential part of a healthy diet. Carbohydrates are found in fruits, vegetables, whole grains, dairy products, legumes, and foods containing added sugars.  Eat healthy foods. Alternate 3 meals with 3 snacks.  Maintain a healthy weight gain. The usual total expected weight gain varies according to your prepregnancy body mass index (BMI).  Carry a medical alert card or wear your medical alert jewelry.  Carry a 15-gram carbohydrate snack with you at all times to treat low blood glucose (hypoglycemia). Some examples of 15-gram carbohydrate snacks include:  Glucose tablets, 3 or 4.  Glucose gel, 15-gram tube.  Raisins, 2 tablespoons (24 g).  Jelly beans, 6.  Animal crackers, 8.  Fruit juice, regular soda, or low-fat milk, 4 ounces (120 mL).  Gummy treats, 9.  Recognize hypoglycemia. Hypoglycemia during pregnancy occurs with blood glucose levels of 60 mg/dL and below. The risk for hypoglycemia increases when fasting or skipping meals, during or after intense exercise, and during sleep. Hypoglycemia symptoms can include:  Tremors or shakes.  Decreased ability to concentrate.  Sweating.  Increased heart rate.  Headache.  Dry mouth.  Hunger.  Irritability.  Anxiety.  Restless sleep.  Altered speech or coordination.  Confusion.  Treat hypoglycemia promptly. If you are alert and able to safely swallow, follow the 15:15 rule:  Take 15-20 grams of rapid-acting glucose or carbohydrate. Rapid-acting options include glucose gel, glucose tablets, or 4 ounces (120 mL) of fruit juice, regular soda, or low-fat milk.  Check your blood glucose level 15 minutes after taking the glucose.  Take 15-20 grams more of glucose if the repeat blood glucose level is still 70 mg/dL or below.  Eat a meal or snack within 1 hour once blood glucose levels return to normal.  Be alert to polyuria  (excess urination) and polydipsia (excess thirst) which are early signs of hyperglycemia. An early awareness of hyperglycemia allows for prompt treatment. Treat hyperglycemia as directed by your health care provider.  Engage in at least 30 minutes of physical activity a day or as directed by your health care provider. Ten minutes of physical activity timed 30 minutes after each meal is encouraged to control postprandial blood glucose levels.  Adjust your insulin dosing and food intake as needed if you start a new exercise or sport.  Follow your sick-day plan at any time you are unable to eat or drink as usual.  Avoid tobacco and alcohol use.  Keep all follow-up visits as directed by your health care provider.  Follow the advice of your health care provider regarding your prenatal and post-delivery (postpartum) appointments, meal planning, exercise, medicines, vitamins, blood tests, other medical tests, and physical activities.  Perform daily skin and foot care. Examine your skin and feet daily for cuts, bruises, redness, nail problems, bleeding, blisters, or sores.  Brush your teeth and gums at least twice a day and floss at least once a day. Follow up with your dentist regularly.  Schedule an eye exam during the first trimester of your pregnancy or as directed by your health care provider.  Share your diabetes management plan with your workplace or school.  Stay up-to-date with immunizations.  Learn to manage stress.    Obtain ongoing diabetes education and support as needed.  Learn about and consider breastfeeding your baby.  You should have your blood sugar level checked 6-12 weeks after delivery. This is done with an oral glucose tolerance test (OGTT). SEEK MEDICAL CARE IF:   You are unable to eat food or drink fluids for more than 6 hours.  You have nausea and vomiting for more than 6 hours.  You have a blood glucose level of 200 mg/dL and you have ketones in your  urine.  There is a change in mental status.  You develop vision problems.  You have a persistent headache.  You have upper abdominal pain or discomfort.  You develop an additional serious illness.  You have diarrhea for more than 6 hours.  You have been sick or have had a fever for a couple of days and are not getting better. SEEK IMMEDIATE MEDICAL CARE IF:   You have difficulty breathing.  You no longer feel the baby moving.  You are bleeding or have discharge from your vagina.  You start having premature contractions or labor. MAKE SURE YOU:  Understand these instructions.  Will watch your condition.  Will get help right away if you are not doing well or get worse. Document Released: 01/29/2001 Document Revised: 03/09/2014 Document Reviewed: 05/21/2012 [employer] Patient Information 2015 [employer]. This information is not intended to replace advice given to you by your health care provider. Make sure you discuss any questions you have with your health care provider.  Breastfeeding Deciding to breastfeed is one of the best choices you can make for you and your baby. A change in hormones during pregnancy causes your breast tissue to grow and increases the number and size of your milk ducts. These hormones also allow proteins, sugars, and fats from your blood supply to make breast milk in your milk-producing glands. Hormones prevent breast milk from being released before your baby is born as well as prompt milk flow after birth. Once breastfeeding has begun, thoughts of your baby, as well as his or her sucking or crying, can stimulate the release of milk from your milk-producing glands.  BENEFITS OF BREASTFEEDING For Your Baby  Your first milk (colostrum) helps your baby's digestive system function better.   There are antibodies in your milk that help your baby fight off infections.   Your baby has a lower incidence of asthma, allergies, and sudden infant death  syndrome.   The nutrients in breast milk are better for your baby than infant formulas and are designed uniquely for your baby's needs.   Breast milk improves your baby's brain development.   Your baby is less likely to develop other conditions, such as childhood obesity, asthma, or type 2 diabetes mellitus.  For You   Breastfeeding helps to create a very special bond between you and your baby.   Breastfeeding is convenient. Breast milk is always available at the correct temperature and costs nothing.   Breastfeeding helps to burn calories and helps you lose the weight gained during pregnancy.   Breastfeeding makes your uterus contract to its prepregnancy size faster and slows bleeding (lochia) after you give birth.   Breastfeeding helps to lower your risk of developing type 2 diabetes mellitus, osteoporosis, and breast or ovarian cancer later in life. SIGNS THAT YOUR BABY IS HUNGRY Early Signs of Hunger  Increased alertness or activity.  Stretching.  Movement of the head from side to side.  Movement of the head and opening of the   mouth when the corner of the mouth or cheek is stroked (rooting).  Increased sucking sounds, smacking lips, cooing, sighing, or squeaking.  Hand-to-mouth movements.  Increased sucking of fingers or hands. Late Signs of Hunger  Fussing.  Intermittent crying. Extreme Signs of Hunger Signs of extreme hunger will require calming and consoling before your baby will be able to breastfeed successfully. Do not wait for the following signs of extreme hunger to occur before you initiate breastfeeding:   Restlessness.  A loud, strong cry.   Screaming. BREASTFEEDING BASICS Breastfeeding Initiation  Find a comfortable place to sit or lie down, with your neck and back well supported.  Place a pillow or rolled up blanket under your baby to bring him or her to the level of your breast (if you are seated). Nursing pillows are specially designed  to help support your arms and your baby while you breastfeed.  Make sure that your baby's abdomen is facing your abdomen.   Gently massage your breast. With your fingertips, massage from your chest wall toward your nipple in a circular motion. This encourages milk flow. You may need to continue this action during the feeding if your milk flows slowly.  Support your breast with 4 fingers underneath and your thumb above your nipple. Make sure your fingers are well away from your nipple and your baby's mouth.   Stroke your baby's lips gently with your finger or nipple.   When your baby's mouth is open wide enough, quickly bring your baby to your breast, placing your entire nipple and as much of the colored area around your nipple (areola) as possible into your baby's mouth.   More areola should be visible above your baby's upper lip than below the lower lip.   Your baby's tongue should be between his or her lower gum and your breast.   Ensure that your baby's mouth is correctly positioned around your nipple (latched). Your baby's lips should create a seal on your breast and be turned out (everted).  It is common for your baby to suck about 2-3 minutes in order to start the flow of breast milk. Latching Teaching your baby how to latch on to your breast properly is very important. An improper latch can cause nipple pain and decreased milk supply for you and poor weight gain in your baby. Also, if your baby is not latched onto your nipple properly, he or she may swallow some air during feeding. This can make your baby fussy. Burping your baby when you switch breasts during the feeding can help to get rid of the air. However, teaching your baby to latch on properly is still the best way to prevent fussiness from swallowing air while breastfeeding. Signs that your baby has successfully latched on to your nipple:    Silent tugging or silent sucking, without causing you pain.   Swallowing  heard between every 3-4 sucks.    Muscle movement above and in front of his or her ears while sucking.  Signs that your baby has not successfully latched on to nipple:   Sucking sounds or smacking sounds from your baby while breastfeeding.  Nipple pain. If you think your baby has not latched on correctly, slip your finger into the corner of your baby's mouth to break the suction and place it between your baby's gums. Attempt breastfeeding initiation again. Signs of Successful Breastfeeding Signs from your baby:   A gradual decrease in the number of sucks or complete cessation of sucking.     Falling asleep.   Relaxation of his or her body.   Retention of a small amount of milk in his or her mouth.   Letting go of your breast by himself or herself. Signs from you:  Breasts that have increased in firmness, weight, and size 1-3 hours after feeding.   Breasts that are softer immediately after breastfeeding.  Increased milk volume, as well as a change in milk consistency and color by the fifth day of breastfeeding.   Nipples that are not sore, cracked, or bleeding. Signs That Your Baby is Getting Enough Milk  Wetting at least 3 diapers in a 24-hour period. The urine should be clear and pale yellow by age 5 days.  At least 3 stools in a 24-hour period by age 5 days. The stool should be soft and yellow.  At least 3 stools in a 24-hour period by age 7 days. The stool should be seedy and yellow.  No loss of weight greater than 10% of birth weight during the first 3 days of age.  Average weight gain of 4-7 ounces (113-198 g) per week after age 4 days.  Consistent daily weight gain by age 5 days, without weight loss after the age of 2 weeks. After a feeding, your baby may spit up a small amount. This is common. BREASTFEEDING FREQUENCY AND DURATION Frequent feeding will help you make more milk and can prevent sore nipples and breast engorgement. Breastfeed when you feel the  need to reduce the fullness of your breasts or when your baby shows signs of hunger. This is called "breastfeeding on demand." Avoid introducing a pacifier to your baby while you are working to establish breastfeeding (the first 4-6 weeks after your baby is born). After this time you may choose to use a pacifier. Research has shown that pacifier use during the first year of a baby's life decreases the risk of sudden infant death syndrome (SIDS). Allow your baby to feed on each breast as long as he or she wants. Breastfeed until your baby is finished feeding. When your baby unlatches or falls asleep while feeding from the first breast, offer the second breast. Because newborns are often sleepy in the first few weeks of life, you may need to awaken your baby to get him or her to feed. Breastfeeding times will vary from baby to baby. However, the following rules can serve as a guide to help you ensure that your baby is properly fed:  Newborns (babies 4 weeks of age or younger) may breastfeed every 1-3 hours.  Newborns should not go longer than 3 hours during the day or 5 hours during the night without breastfeeding.  You should breastfeed your baby a minimum of 8 times in a 24-hour period until you begin to introduce solid foods to your baby at around 6 months of age. BREAST MILK PUMPING Pumping and storing breast milk allows you to ensure that your baby is exclusively fed your breast milk, even at times when you are unable to breastfeed. This is especially important if you are going back to work while you are still breastfeeding or when you are not able to be present during feedings. Your lactation consultant can give you guidelines on how long it is safe to store breast milk.  A breast pump is a machine that allows you to pump milk from your breast into a sterile bottle. The pumped breast milk can then be stored in a refrigerator or freezer. Some breast pumps are operated by   hand, while others use  electricity. Ask your lactation consultant which type will work best for you. Breast pumps can be purchased, but some hospitals and breastfeeding support groups lease breast pumps on a monthly basis. A lactation consultant can teach you how to hand express breast milk, if you prefer not to use a pump.  CARING FOR YOUR BREASTS WHILE YOU BREASTFEED Nipples can become dry, cracked, and sore while breastfeeding. The following recommendations can help keep your breasts moisturized and healthy:  Avoid using soap on your nipples.   Wear a supportive bra. Although not required, special nursing bras and tank tops are designed to allow access to your breasts for breastfeeding without taking off your entire bra or top. Avoid wearing underwire-style bras or extremely tight bras.  Air dry your nipples for 3-4minutes after each feeding.   Use only cotton bra pads to absorb leaked breast milk. Leaking of breast milk between feedings is normal.   Use lanolin on your nipples after breastfeeding. Lanolin helps to maintain your skin's normal moisture barrier. If you use pure lanolin, you do not need to wash it off before feeding your baby again. Pure lanolin is not toxic to your baby. You may also hand express a few drops of breast milk and gently massage that milk into your nipples and allow the milk to air dry. In the first few weeks after giving birth, some women experience extremely full breasts (engorgement). Engorgement can make your breasts feel heavy, warm, and tender to the touch. Engorgement peaks within 3-5 days after you give birth. The following recommendations can help ease engorgement:  Completely empty your breasts while breastfeeding or pumping. You may want to start by applying warm, moist heat (in the shower or with warm water-soaked hand towels) just before feeding or pumping. This increases circulation and helps the milk flow. If your baby does not completely empty your breasts while  breastfeeding, pump any extra milk after he or she is finished.  Wear a snug bra (nursing or regular) or tank top for 1-2 days to signal your body to slightly decrease milk production.  Apply ice packs to your breasts, unless this is too uncomfortable for you.  Make sure that your baby is latched on and positioned properly while breastfeeding. If engorgement persists after 48 hours of following these recommendations, contact your health care provider or a lactation consultant. OVERALL HEALTH CARE RECOMMENDATIONS WHILE BREASTFEEDING  Eat healthy foods. Alternate between meals and snacks, eating 3 of each per day. Because what you eat affects your breast milk, some of the foods may make your baby more irritable than usual. Avoid eating these foods if you are sure that they are negatively affecting your baby.  Drink milk, fruit juice, and water to satisfy your thirst (about 10 glasses a day).   Rest often, relax, and continue to take your prenatal vitamins to prevent fatigue, stress, and anemia.  Continue breast self-awareness checks.  Avoid chewing and smoking tobacco.  Avoid alcohol and drug use. Some medicines that may be harmful to your baby can pass through breast milk. It is important to ask your health care provider before taking any medicine, including all over-the-counter and prescription medicine as well as vitamin and herbal supplements. It is possible to become pregnant while breastfeeding. If birth control is desired, ask your health care provider about options that will be safe for your baby. SEEK MEDICAL CARE IF:   You feel like you want to stop breastfeeding or have become   frustrated with breastfeeding.  You have painful breasts or nipples.  Your nipples are cracked or bleeding.  Your breasts are red, tender, or warm.  You have a swollen area on either breast.  You have a fever or chills.  You have nausea or vomiting.  You have drainage other than breast milk from  your nipples.  Your breasts do not become full before feedings by the fifth day after you give birth.  You feel sad and depressed.  Your baby is too sleepy to eat well.  Your baby is having trouble sleeping.   Your baby is wetting less than 3 diapers in a 24-hour period.  Your baby has less than 3 stools in a 24-hour period.  Your baby's skin or the white part of his or her eyes becomes yellow.   Your baby is not gaining weight by 5 days of age. SEEK IMMEDIATE MEDICAL CARE IF:   Your baby is overly tired (lethargic) and does not want to wake up and feed.  Your baby develops an unexplained fever. Document Released: 10/23/2005 Document Revised: 10/28/2013 Document Reviewed: 04/16/2013 [employer] Patient Information 2015 [employer]. This information is not intended to replace advice given to you by your health care provider. Make sure you discuss any questions you have with your health care provider.  

## 2014-06-03 ENCOUNTER — Encounter: Payer: Self-pay | Admitting: *Deleted

## 2014-06-08 ENCOUNTER — Ambulatory Visit (INDEPENDENT_AMBULATORY_CARE_PROVIDER_SITE_OTHER): Payer: BC Managed Care – PPO | Admitting: Family Medicine

## 2014-06-08 VITALS — BP 113/71 | HR 81 | Temp 97.9°F | Wt 159.6 lb

## 2014-06-08 DIAGNOSIS — O3680X0 Pregnancy with inconclusive fetal viability, not applicable or unspecified: Secondary | ICD-10-CM | POA: Diagnosis not present

## 2014-06-08 DIAGNOSIS — O3680X1 Pregnancy with inconclusive fetal viability, fetus 1: Secondary | ICD-10-CM

## 2014-06-08 LAB — US OB FOLLOW UP

## 2014-06-08 LAB — POCT URINALYSIS DIP (DEVICE)
BILIRUBIN URINE: NEGATIVE
GLUCOSE, UA: NEGATIVE mg/dL
Hgb urine dipstick: NEGATIVE
KETONES UR: NEGATIVE mg/dL
Leukocytes, UA: NEGATIVE
Nitrite: NEGATIVE
Protein, ur: NEGATIVE mg/dL
Urobilinogen, UA: 0.2 mg/dL (ref 0.0–1.0)
pH: 6 (ref 5.0–8.0)

## 2014-06-08 NOTE — Patient Instructions (Signed)
Glyburide tablets What is this medicine? GLYBURIDE (GLYE byoor ide) helps to treat type 2 diabetes. Treatment is combined with diet and exercise. The medicine helps your body to use insulin better. This medicine may be used for other purposes; ask your health care provider or pharmacist if you have questions. COMMON BRAND NAME(S): Diabeta, Glycron, Glynase PresTab, Micronase What should I tell my health care provider before I take this medicine? They need to know if you have any of these conditions: -diabetic ketoacidosis -glucose-6-phosphate dehydrogenase deficiency -heart disease -kidney disease -liver disease -severe infection or injury -thyroid disease -an unusual or allergic reaction to glyburide, sulfa drugs, other medicines, foods, dyes, or preservatives -pregnant or trying to get pregnant -breast-feeding How should I use this medicine? Take this medicine by mouth with a glass of water. Follow the directions on the prescription label. If you take this medicine once a day, take it with breakfast or the first main meal of the day. Take your medicine at the same time each day. Do not take more often than directed. Talk to your pediatrician regarding the use of this medicine in children. Special care may be needed. Elderly patients over 32 years old may have a stronger reaction and need a smaller dose. Overdosage: If you think you have taken too much of this medicine contact a poison control center or emergency room at once. NOTE: This medicine is only for you. Do not share this medicine with others. What if I miss a dose? If you miss a dose, take it as soon as you can. If it is almost time for your next dose, take only that dose. Do not take double or extra doses. What may interact with this medicine? -bosentan -chloramphenicol -cisapride -clarithromycin -medicines for fungal or yeast infections -metoclopramide -probenecid -rifampin -warfarin Many medications may cause an  increase or decrease in blood sugar, these include: -alcohol containing beverages -angiotensin converting enzyme inhibitors like enalapril, captopril, and lisinopril -aspirin and aspirin-like drugs -chloramphenicol -chromium -female hormones, like estrogens or progestins and birth control pills -fluoxetine -heart medicines like disopyramide -isoniazid -female hormones or anabolic steroids -medicines called MAO Inhibitors like Nardil, Parnate, Marplan, Eldepryl -medicines for allergies, asthma, cold, or cough -medicines for mental problems -medicines for weight loss -niacin -NSAIDs, medicines for pain and inflammation, like ibuprofen or naproxen -pentamidine -phenytoin -probenecid -quinolone antibiotics like ciprofloxacin, levofloxacin, ofloxacin -some herbal dietary supplements -steroid medicines like prednisone or cortisone -thyroid medicine -water pills or diuretics This list may not describe all possible interactions. Give your health care provider a list of all the medicines, herbs, non-prescription drugs, or dietary supplements you use. Also tell them if you smoke, drink alcohol, or use illegal drugs. Some items may interact with your medicine. What should I watch for while using this medicine? Visit your doctor or health care professional for regular checks on your progress. A test called the HbA1C (A1C) will be monitored. This is a simple blood test. It measures your blood sugar control over the last 2 to 3 months. You will receive this test every 3 to 6 months. Learn how to check your blood sugar. Learn the symptoms of low and high blood sugar and how to manage them. Always carry a quick-source of sugar with you in case you have symptoms of low blood sugar. Examples include hard sugar candy or glucose tablets. Make sure others know that you can choke if you eat or drink when you develop serious symptoms of low blood sugar, such as seizures or unconsciousness.  They must get medical  help at once. Tell your doctor or health care professional if you have high blood sugar. You might need to change the dose of your medicine. If you are sick or exercising more than usual, you might need to change the dose of your medicine. Do not skip meals. Ask your doctor or health care professional if you should avoid alcohol. Many nonprescription cough and cold products contain sugar or alcohol. These can affect blood sugar. This medicine can make you more sensitive to the sun. Keep out of the sun. If you cannot avoid being in the sun, wear protective clothing and use sunscreen. Do not use sun lamps or tanning beds/booths. Wear a medical ID bracelet or chain, and carry a card that describes your disease and details of your medicine and dosage times. What side effects may I notice from receiving this medicine? Side effects that you should report to your doctor or health care professional as soon as possible: -allergic reactions like skin rash, itching or hives, swelling of the face, lips, or tongue -breathing problems -dark urine -fever, chills, sore throat -signs and symptoms of low blood sugar such as feeling anxious, confusion, dizziness, increased hunger, unusually weak or tired, sweating, shakiness, cold, irritable, headache, blurred vision, fast heartbeat, loss of consciousness -unusual bleeding or bruising -yellowing of the eyes or skin Side effects that usually do not require medical attention (report to your doctor or health care professional if they continue or are bothersome): -diarrhea -dizziness -headache -heartburn -nausea -stomach gas This list may not describe all possible side effects. Call your doctor for medical advice about side effects. You may report side effects to FDA at 1-800-FDA-1088. Where should I keep my medicine? Keep out of the reach of children. Store at room temperature between 15 and 30 degrees C (59 and 86 degrees F). Throw away any unused medicine after  the expiration date. NOTE: This sheet is a summary. It may not cover all possible information. If you have questions about this medicine, talk to your doctor, pharmacist, or health care provider.  2015, [employer]vier/Gold Standard. (2013-02-05 14:44:31)

## 2014-06-08 NOTE — Progress Notes (Signed)
Bedside US for viability;  Single IUP, FM present, FHR = 152 per PW doppler.  Dr. Loreta AveAcosta notified.

## 2014-06-08 NOTE — Progress Notes (Signed)
Asked pt about feeling baby move at last visit pt informed me that she is not sure.  But today when asked she has not felt baby move

## 2014-06-08 NOTE — Progress Notes (Signed)
Nutrition note: GDM diet f/u Pt's BS from last week: fasting 83-90; 2hr pp: 74-190 Pt reports she took the prescribed dose of her meds for ~2d but they made her feel dizzy. Pt reports that she bought measuring cups & has been trying to measure but that she's hungry often because of decreasing her portions. Reviewed foods that have CHO & foods that do not. Encouraged pt to continue monitoring & decreasing her CHO portions & eating the non-CHO foods if she's still hungry. Pt stated she understands a little more after today's review about which foods make her BS increase. Encouraged pt to follow MDs recs with meds & continue to follow GDM diet (3 meals & 2-3 snacks/d with 2 CHO @ breakfast & snacks and 3 CHO @ lunch & dinner). F/u in 4-6 wks Blondell RevealLaura Staceyann Knouff, MS, RD, LDN, [facility]

## 2014-06-08 NOTE — Progress Notes (Signed)
Patient is 33 y.o. G2P1001 [id number].  Thinks maybe starting to feel baby move, denies LOF, VB.  Overall feeling well, did not make increase in glyburide from 2.5mg  BID to 5mg  qAM and 2.5mg  qPM as it made her feel bad, felt it made her sugar increase. Blood sugar log reviewed Fasting BS: 85-90 2h pp: 93-190 (14 of 16 out of range) ==> advised to increase to 5mg  qAM, continue with 2.5mg  qPM - unable to doppler fetal heart tones, sent to sono and confirmed

## 2014-06-22 ENCOUNTER — Encounter: Payer: Self-pay | Admitting: Obstetrics and Gynecology

## 2014-06-22 ENCOUNTER — Ambulatory Visit (INDEPENDENT_AMBULATORY_CARE_PROVIDER_SITE_OTHER): Payer: BC Managed Care – PPO | Admitting: Obstetrics and Gynecology

## 2014-06-22 VITALS — BP 111/68 | HR 86 | Temp 98.4°F | Wt 158.8 lb

## 2014-06-22 DIAGNOSIS — O0990 Supervision of high risk pregnancy, unspecified, unspecified trimester: Secondary | ICD-10-CM

## 2014-06-22 DIAGNOSIS — O0992 Supervision of high risk pregnancy, unspecified, second trimester: Secondary | ICD-10-CM

## 2014-06-22 DIAGNOSIS — O289 Unspecified abnormal findings on antenatal screening of mother: Secondary | ICD-10-CM

## 2014-06-22 DIAGNOSIS — O24419 Gestational diabetes mellitus in pregnancy, unspecified control: Secondary | ICD-10-CM

## 2014-06-22 DIAGNOSIS — O283 Abnormal ultrasonic finding on antenatal screening of mother: Secondary | ICD-10-CM

## 2014-06-22 DIAGNOSIS — O34219 Maternal care for unspecified type scar from previous cesarean delivery: Secondary | ICD-10-CM

## 2014-06-22 DIAGNOSIS — O99810 Abnormal glucose complicating pregnancy: Secondary | ICD-10-CM

## 2014-06-22 LAB — POCT URINALYSIS DIP (DEVICE)
BILIRUBIN URINE: NEGATIVE
GLUCOSE, UA: NEGATIVE mg/dL
Hgb urine dipstick: NEGATIVE
Ketones, ur: NEGATIVE mg/dL
Leukocytes, UA: NEGATIVE
Nitrite: NEGATIVE
Protein, ur: NEGATIVE mg/dL
SPECIFIC GRAVITY, URINE: 1.01 (ref 1.005–1.030)
UROBILINOGEN UA: 0.2 mg/dL (ref 0.0–1.0)
pH: 6 (ref 5.0–8.0)

## 2014-06-22 NOTE — Progress Notes (Signed)
Patient is doing well without complaints. Anatomy ultrasound scheduled for next Wednesday. CBGs fasting all within range. 2hr pp half of the values within range. Patient is not consistently following diet. Education again provided. Advised patient to exercise moderately at least 30 minutes daily.

## 2014-06-22 NOTE — Progress Notes (Signed)
Pt is concerned about taking medication for diabetes after the baby is born.

## 2014-07-01 ENCOUNTER — Ambulatory Visit (HOSPITAL_COMMUNITY)
Admission: RE | Admit: 2014-07-01 | Discharge: 2014-07-01 | Disposition: A | Discharge status: Home / Self Care | Payer: BC Managed Care – PPO | Source: Ambulatory Visit | Attending: Family Medicine | Admitting: Family Medicine

## 2014-07-01 DIAGNOSIS — O283 Abnormal ultrasonic finding on antenatal screening of mother: Secondary | ICD-10-CM

## 2014-07-01 DIAGNOSIS — O24419 Gestational diabetes mellitus in pregnancy, unspecified control: Secondary | ICD-10-CM

## 2014-07-01 DIAGNOSIS — Z3689 Encounter for other specified antenatal screening: Secondary | ICD-10-CM | POA: Diagnosis not present

## 2014-07-01 DIAGNOSIS — O99810 Abnormal glucose complicating pregnancy: Secondary | ICD-10-CM | POA: Diagnosis not present

## 2014-07-06 ENCOUNTER — Ambulatory Visit (INDEPENDENT_AMBULATORY_CARE_PROVIDER_SITE_OTHER): Payer: BC Managed Care – PPO | Admitting: Obstetrics & Gynecology

## 2014-07-06 VITALS — BP 105/68 | HR 92 | Temp 97.7°F | Wt 158.5 lb

## 2014-07-06 DIAGNOSIS — O24419 Gestational diabetes mellitus in pregnancy, unspecified control: Secondary | ICD-10-CM

## 2014-07-06 DIAGNOSIS — O99810 Abnormal glucose complicating pregnancy: Secondary | ICD-10-CM

## 2014-07-06 DIAGNOSIS — O0992 Supervision of high risk pregnancy, unspecified, second trimester: Secondary | ICD-10-CM

## 2014-07-06 DIAGNOSIS — O0990 Supervision of high risk pregnancy, unspecified, unspecified trimester: Secondary | ICD-10-CM

## 2014-07-06 LAB — POCT URINALYSIS DIP (DEVICE)
Bilirubin Urine: NEGATIVE
GLUCOSE, UA: 250 mg/dL — AB
Hgb urine dipstick: NEGATIVE
KETONES UR: NEGATIVE mg/dL
Leukocytes, UA: NEGATIVE
NITRITE: NEGATIVE
Protein, ur: NEGATIVE mg/dL
Specific Gravity, Urine: 1.02 (ref 1.005–1.030)
Urobilinogen, UA: 0.2 mg/dL (ref 0.0–1.0)
pH: 6.5 (ref 5.0–8.0)

## 2014-07-06 MED ORDER — PRENATAL PLUS 27-1 MG PO TABS: 1.0000 | ORAL_TABLET | Freq: Every day | ORAL | Status: AC

## 2014-07-06 NOTE — Progress Notes (Signed)
Blood sugars are within range; continue Glyburide /2.5 mg.  Of note, only recorded 1/2 of the week, empahsized that we need to see all values Will reschedule fetal ECHO and Optho exam Anatomy scan normal except increased nuchal fold, follow up ordered at [facility] No other complaints or concerns.  Routine obstetric precautions reviewed. PNV refilled.

## 2014-07-06 NOTE — Patient Instructions (Signed)
Return to clinic for any obstetric concerns or go to [facility] for evaluation  

## 2014-07-06 NOTE — Progress Notes (Signed)
Patient reports feeling a little bit of movement before but none lately; also reports some cramping; needs refill on PNV

## 2014-07-20 ENCOUNTER — Ambulatory Visit (INDEPENDENT_AMBULATORY_CARE_PROVIDER_SITE_OTHER): Payer: BC Managed Care – PPO | Admitting: Obstetrics and Gynecology

## 2014-07-20 VITALS — BP 119/68 | HR 91 | Temp 98.2°F | Wt 161.5 lb

## 2014-07-20 DIAGNOSIS — O99810 Abnormal glucose complicating pregnancy: Secondary | ICD-10-CM

## 2014-07-20 DIAGNOSIS — O24419 Gestational diabetes mellitus in pregnancy, unspecified control: Secondary | ICD-10-CM

## 2014-07-20 LAB — POCT URINALYSIS DIP (DEVICE)
Bilirubin Urine: NEGATIVE
GLUCOSE, UA: 500 mg/dL — AB
HGB URINE DIPSTICK: NEGATIVE
Ketones, ur: NEGATIVE mg/dL
Leukocytes, UA: NEGATIVE
NITRITE: NEGATIVE
PH: 6.5 (ref 5.0–8.0)
PROTEIN: NEGATIVE mg/dL
Specific Gravity, Urine: 1.02 (ref 1.005–1.030)
Urobilinogen, UA: 0.2 mg/dL (ref 0.0–1.0)

## 2014-07-20 NOTE — Progress Notes (Signed)
Pt is undecided concerning flu vaccine, information sheet given.

## 2014-07-20 NOTE — Progress Notes (Signed)
Blood sugars are within range; continue Glyburide /2.5 mg.  Fasting: 65-90 (only 1 record of 90) 2 hr pp: 83 - 125; majority < 120, but 2 hr pp w/ breakfast 122, 130, 120, 135. Discussed dietary measures, eating cereal, will cut out.   Have rescheduled fetal ECHO (10/8) and Optho exam (still has not received appt)   Anatomy scan normal except increased nuchal fold, follow up ordered at [facility] in 6-8 wks  + FM, no VB, no LOF   Thinking about flu shot, thinking she may get it.

## 2014-07-29 ENCOUNTER — Ambulatory Visit (HOSPITAL_COMMUNITY): Payer: BC Managed Care – PPO

## 2014-08-05 ENCOUNTER — Ambulatory Visit (HOSPITAL_COMMUNITY)
Admission: RE | Admit: 2014-08-05 | Discharge: 2014-08-05 | Disposition: A | Discharge status: Home / Self Care | Payer: BC Managed Care – PPO | Source: Ambulatory Visit | Attending: Obstetrics & Gynecology | Admitting: Obstetrics & Gynecology

## 2014-08-05 ENCOUNTER — Encounter (HOSPITAL_COMMUNITY): Payer: Self-pay

## 2014-08-05 VITALS — BP 102/68 | HR 87 | Wt 163.2 lb

## 2014-08-05 DIAGNOSIS — O283 Abnormal ultrasonic finding on antenatal screening of mother: Secondary | ICD-10-CM

## 2014-08-05 DIAGNOSIS — O289 Unspecified abnormal findings on antenatal screening of mother: Secondary | ICD-10-CM | POA: Diagnosis not present

## 2014-08-05 DIAGNOSIS — O99810 Abnormal glucose complicating pregnancy: Secondary | ICD-10-CM | POA: Diagnosis present

## 2014-08-05 DIAGNOSIS — O24419 Gestational diabetes mellitus in pregnancy, unspecified control: Secondary | ICD-10-CM

## 2014-08-10 ENCOUNTER — Ambulatory Visit (INDEPENDENT_AMBULATORY_CARE_PROVIDER_SITE_OTHER): Payer: BC Managed Care – PPO | Admitting: Family Medicine

## 2014-08-10 VITALS — BP 120/72 | HR 98 | Temp 98.5°F | Wt 161.1 lb

## 2014-08-10 DIAGNOSIS — O24419 Gestational diabetes mellitus in pregnancy, unspecified control: Secondary | ICD-10-CM

## 2014-08-10 LAB — POCT URINALYSIS DIP (DEVICE)
Bilirubin Urine: NEGATIVE
Glucose, UA: NEGATIVE mg/dL
Hgb urine dipstick: NEGATIVE
Nitrite: NEGATIVE
Protein, ur: NEGATIVE mg/dL
Specific Gravity, Urine: 1.02 (ref 1.005–1.030)
Urobilinogen, UA: 0.2 mg/dL (ref 0.0–1.0)
pH: 6 (ref 5.0–8.0)

## 2014-08-10 NOTE — Progress Notes (Signed)
Patient is 33 y.o. G2P1001 [id number], +FM, denies LOF, VB, contractions A2DM glyburide 5/2.5mg  - fasting: 60-76 (0/6 out of range); 2h postprandial 79-122 (3/13 out of range)

## 2014-09-02 ENCOUNTER — Ambulatory Visit (HOSPITAL_COMMUNITY)
Admission: RE | Admit: 2014-09-02 | Discharge: 2014-09-02 | Disposition: A | Discharge status: Home / Self Care | Payer: Medicaid Other | Source: Ambulatory Visit | Attending: Obstetrics & Gynecology | Admitting: Obstetrics & Gynecology

## 2014-09-02 ENCOUNTER — Other Ambulatory Visit (HOSPITAL_COMMUNITY): Payer: Self-pay | Admitting: Obstetrics and Gynecology

## 2014-09-02 VITALS — BP 111/69 | HR 93 | Wt 165.0 lb

## 2014-09-02 DIAGNOSIS — O24419 Gestational diabetes mellitus in pregnancy, unspecified control: Secondary | ICD-10-CM

## 2014-09-02 DIAGNOSIS — O288 Other abnormal findings on antenatal screening of mother: Secondary | ICD-10-CM | POA: Diagnosis not present

## 2014-09-02 DIAGNOSIS — O24420 Gestational diabetes mellitus in childbirth, diet controlled: Secondary | ICD-10-CM | POA: Insufficient documentation

## 2014-09-02 DIAGNOSIS — Z3A29 29 weeks gestation of pregnancy: Secondary | ICD-10-CM | POA: Insufficient documentation

## 2014-09-02 DIAGNOSIS — O283 Abnormal ultrasonic finding on antenatal screening of mother: Secondary | ICD-10-CM

## 2014-09-02 DIAGNOSIS — O24410 Gestational diabetes mellitus in pregnancy, diet controlled: Secondary | ICD-10-CM | POA: Diagnosis present

## 2014-09-07 ENCOUNTER — Ambulatory Visit (INDEPENDENT_AMBULATORY_CARE_PROVIDER_SITE_OTHER): Payer: BC Managed Care – PPO | Admitting: Family Medicine

## 2014-09-07 ENCOUNTER — Encounter (HOSPITAL_COMMUNITY): Payer: Self-pay

## 2014-09-07 VITALS — BP 127/81 | HR 90 | Temp 98.2°F | Wt 165.5 lb

## 2014-09-07 DIAGNOSIS — Z23 Encounter for immunization: Secondary | ICD-10-CM | POA: Diagnosis not present

## 2014-09-07 DIAGNOSIS — O0992 Supervision of high risk pregnancy, unspecified, second trimester: Secondary | ICD-10-CM

## 2014-09-07 DIAGNOSIS — O24419 Gestational diabetes mellitus in pregnancy, unspecified control: Secondary | ICD-10-CM

## 2014-09-07 DIAGNOSIS — Z3493 Encounter for supervision of normal pregnancy, unspecified, third trimester: Secondary | ICD-10-CM

## 2014-09-07 LAB — CBC
HCT: 33.1 % — ABNORMAL LOW (ref 36.0–46.0)
Hemoglobin: 11.2 g/dL — ABNORMAL LOW (ref 12.0–15.0)
MCH: 24.9 pg — AB (ref 26.0–34.0)
MCHC: 33.8 g/dL (ref 30.0–36.0)
MCV: 73.7 fL — ABNORMAL LOW (ref 78.0–100.0)
Platelets: 179 10*3/uL (ref 150–400)
RBC: 4.49 MIL/uL (ref 3.87–5.11)
RDW: 14.8 % (ref 11.5–15.5)
WBC: 9.8 10*3/uL (ref 4.0–10.5)

## 2014-09-07 LAB — POCT URINALYSIS DIP (DEVICE)
Bilirubin Urine: NEGATIVE
GLUCOSE, UA: NEGATIVE mg/dL
Hgb urine dipstick: NEGATIVE
Ketones, ur: NEGATIVE mg/dL
NITRITE: NEGATIVE
Protein, ur: NEGATIVE mg/dL
SPECIFIC GRAVITY, URINE: 1.015 (ref 1.005–1.030)
UROBILINOGEN UA: 0.2 mg/dL (ref 0.0–1.0)
pH: 6.5 (ref 5.0–8.0)

## 2014-09-07 MED ORDER — TETANUS-DIPHTH-ACELL PERTUSSIS 5-2.5-18.5 LF-MCG/0.5 IM SUSP
0.5000 mL | Freq: Once | INTRAMUSCULAR | Status: AC
  Administered 2014-09-07: 0.5 mL via INTRAMUSCULAR

## 2014-09-07 NOTE — Progress Notes (Signed)
No complaints today. 28 wk labs with flu vaccine/Tdap vaccine

## 2014-09-07 NOTE — Progress Notes (Signed)
Patient is 33 y.o. G2P1001 4214w5d.  +FM, denies LOF, VB, contractions, vaginal discharge. A2DM on 5/2.5 glyburide Fasting: 63-90: 1/7 >90  2h PP: 97-135: 7/16 >120 => diet reinforced, no changes in medication, advised may need to increase glyburide at next visit

## 2014-09-08 LAB — HIV ANTIBODY (ROUTINE TESTING W REFLEX): HIV 1&2 Ab, 4th Generation: NONREACTIVE

## 2014-09-08 LAB — RPR

## 2014-09-28 ENCOUNTER — Ambulatory Visit (INDEPENDENT_AMBULATORY_CARE_PROVIDER_SITE_OTHER): Payer: Medicaid Other | Admitting: Obstetrics and Gynecology

## 2014-09-28 VITALS — BP 110/63 | HR 84 | Temp 98.0°F | Wt 167.7 lb

## 2014-09-28 DIAGNOSIS — O24419 Gestational diabetes mellitus in pregnancy, unspecified control: Secondary | ICD-10-CM

## 2014-09-28 NOTE — Addendum Note (Signed)
Addended by: Marchelle FolksAY, Conley Pawling L on: 09/28/2014 12:00 PM   Modules accepted: Orders, Level of Service

## 2014-09-28 NOTE — Progress Notes (Signed)
BPP scheduled 11/27 @ 1515

## 2014-09-28 NOTE — Progress Notes (Signed)
Patient is 33 y.o. G2P1001 920w5d. +FM, denies LOF, VB, contractions, vaginal discharge.  A2DM on 5/2.5 glyburide  Fasting: 63-90: 0/20 >90  2h PP: 87-123: 2/50 >120 => diet reinforced, no changes in medication, advised may need to increase glyburide at next visit - States that she did get fetal [facility] - has not had optho exam - f/up US scheduled for 11/25 - start scheduling NST/BPP 2x/wk now > 32 wks

## 2014-09-28 NOTE — Addendum Note (Signed)
Addended by: Jill SideAY, DIANE L on: 09/28/2014 12:03 PM   Modules accepted: Level of Service

## 2014-09-30 ENCOUNTER — Encounter: Payer: Self-pay | Admitting: Obstetrics & Gynecology

## 2014-09-30 ENCOUNTER — Ambulatory Visit (HOSPITAL_COMMUNITY): Payer: BC Managed Care – PPO

## 2014-10-02 ENCOUNTER — Ambulatory Visit (HOSPITAL_COMMUNITY)
Admission: RE | Admit: 2014-10-02 | Discharge: 2014-10-02 | Disposition: A | Discharge status: Home / Self Care | Payer: Medicaid Other | Source: Ambulatory Visit | Attending: Obstetrics and Gynecology | Admitting: Obstetrics and Gynecology

## 2014-10-02 DIAGNOSIS — O352XX0 Maternal care for (suspected) hereditary disease in fetus, not applicable or unspecified: Secondary | ICD-10-CM | POA: Diagnosis not present

## 2014-10-02 DIAGNOSIS — O3421 Maternal care for scar from previous cesarean delivery: Secondary | ICD-10-CM | POA: Insufficient documentation

## 2014-10-02 DIAGNOSIS — Z3A33 33 weeks gestation of pregnancy: Secondary | ICD-10-CM | POA: Diagnosis not present

## 2014-10-02 DIAGNOSIS — O337 Maternal care for disproportion due to other fetal deformities: Secondary | ICD-10-CM | POA: Diagnosis not present

## 2014-10-02 DIAGNOSIS — O359XX0 Maternal care for (suspected) fetal abnormality and damage, unspecified, not applicable or unspecified: Secondary | ICD-10-CM | POA: Diagnosis present

## 2014-10-02 DIAGNOSIS — O24113 Pre-existing diabetes mellitus, type 2, in pregnancy, third trimester: Secondary | ICD-10-CM | POA: Diagnosis present

## 2014-10-02 DIAGNOSIS — O24419 Gestational diabetes mellitus in pregnancy, unspecified control: Secondary | ICD-10-CM

## 2014-10-05 ENCOUNTER — Ambulatory Visit (INDEPENDENT_AMBULATORY_CARE_PROVIDER_SITE_OTHER): Payer: Medicaid Other | Admitting: Family Medicine

## 2014-10-05 VITALS — BP 106/67 | HR 89 | Wt 169.4 lb

## 2014-10-05 DIAGNOSIS — O24912 Unspecified diabetes mellitus in pregnancy, second trimester: Secondary | ICD-10-CM

## 2014-10-05 DIAGNOSIS — O24419 Gestational diabetes mellitus in pregnancy, unspecified control: Secondary | ICD-10-CM

## 2014-10-05 MED ORDER — GLYBURIDE 2.5 MG PO TABS: 2.5000 mg | ORAL_TABLET | Freq: Two times a day (BID) | ORAL | Status: DC

## 2014-10-05 NOTE — Progress Notes (Signed)
All numbers within range.  Continue glyburide 5/2.5mg . Category 1 tracing with baseline in 140s.  Moderate variability, multiple accelerations, no decelerations. No other complaints.  Labor precautions and fetal movement precautions given.

## 2014-10-05 NOTE — Patient Instructions (Signed)
Third Trimester of Pregnancy The third trimester is from week 29 through week 42, months 7 through 9. This trimester is when your unborn baby (fetus) is growing very fast. At the end of the ninth month, the unborn baby is about 20 inches in length. It weighs about 6-10 pounds.  HOME CARE   Avoid all smoking, herbs, and alcohol. Avoid drugs not approved by your doctor.  Only take medicine as told by your doctor. Some medicines are safe and some are not during pregnancy.  Exercise only as told by your doctor. Stop exercising if you start having cramps.  Eat regular, healthy meals.  Wear a good support bra if your breasts are tender.  Do not use hot tubs, steam rooms, or saunas.  Wear your seat belt when driving.  Avoid raw meat, uncooked cheese, and liter boxes and soil used by cats.  Take your prenatal vitamins.  Try taking medicine that helps you poop (stool softener) as needed, and if your doctor approves. Eat more fiber by eating fresh fruit, vegetables, and whole grains. Drink enough fluids to keep your pee (urine) clear or pale yellow.  Take warm water baths (sitz baths) to soothe pain or discomfort caused by hemorrhoids. Use hemorrhoid cream if your doctor approves.  If you have puffy, bulging veins (varicose veins), wear support hose. Raise (elevate) your feet for 15 minutes, 3-4 times a day. Limit salt in your diet.  Avoid heavy lifting, wear low heels, and sit up straight.  Rest with your legs raised if you have leg cramps or low back pain.  Visit your dentist if you have not gone during your pregnancy. Use a soft toothbrush to brush your teeth. Be gentle when you floss.  You can have sex (intercourse) unless your doctor tells you not to.  Do not travel far distances unless you must. Only do so with your doctor's approval.  Take prenatal classes.  Practice driving to the hospital.  Pack your hospital bag.  Prepare the baby's room.  Go to your doctor visits. GET  HELP IF:  You are not sure if you are in labor or if your water has broken.  You are dizzy.  You have mild cramps or pressure in your lower belly (abdominal).  You have a nagging pain in your belly area.  You continue to feel sick to your stomach (nauseous), throw up (vomit), or have watery poop (diarrhea).  You have bad smelling fluid coming from your vagina.  You have pain with peeing (urination). GET HELP RIGHT AWAY IF:   You have a fever.  You are leaking fluid from your vagina.  You are spotting or bleeding from your vagina.  You have severe belly cramping or pain.  You lose or gain weight rapidly.  You have trouble catching your breath and have chest pain.  You notice sudden or extreme puffiness (swelling) of your face, hands, ankles, feet, or legs.  You have not felt the baby move in over an hour.  You have severe headaches that do not go away with medicine.  You have vision changes. Document Released: 01/17/2010 Document Revised: 02/17/2013 Document Reviewed: 12/24/2012 ExitCare Patient Information 2015 [employer]. This information is not intended to replace advice given to you by your health care provider. Make sure you discuss any questions you have with your health care provider.  

## 2014-10-05 NOTE — Progress Notes (Signed)
US for growth scheduled 12/3 @ [facility].  Pt needs refill of glyburide.

## 2014-10-07 ENCOUNTER — Ambulatory Visit (HOSPITAL_COMMUNITY): Payer: BC Managed Care – PPO

## 2014-10-08 ENCOUNTER — Ambulatory Visit (HOSPITAL_COMMUNITY)
Admission: RE | Admit: 2014-10-08 | Discharge: 2014-10-08 | Disposition: A | Discharge status: Home / Self Care | Payer: Medicaid Other | Source: Ambulatory Visit | Attending: Obstetrics & Gynecology | Admitting: Obstetrics & Gynecology

## 2014-10-08 DIAGNOSIS — Z3A34 34 weeks gestation of pregnancy: Secondary | ICD-10-CM | POA: Insufficient documentation

## 2014-10-08 DIAGNOSIS — O3421 Maternal care for scar from previous cesarean delivery: Secondary | ICD-10-CM | POA: Diagnosis not present

## 2014-10-08 DIAGNOSIS — E119 Type 2 diabetes mellitus without complications: Secondary | ICD-10-CM | POA: Diagnosis not present

## 2014-10-08 DIAGNOSIS — O24419 Gestational diabetes mellitus in pregnancy, unspecified control: Secondary | ICD-10-CM

## 2014-10-08 DIAGNOSIS — O358XX0 Maternal care for other (suspected) fetal abnormality and damage, not applicable or unspecified: Secondary | ICD-10-CM | POA: Diagnosis not present

## 2014-10-08 DIAGNOSIS — O24113 Pre-existing diabetes mellitus, type 2, in pregnancy, third trimester: Secondary | ICD-10-CM | POA: Insufficient documentation

## 2014-10-08 DIAGNOSIS — O283 Abnormal ultrasonic finding on antenatal screening of mother: Secondary | ICD-10-CM

## 2014-10-12 ENCOUNTER — Ambulatory Visit (INDEPENDENT_AMBULATORY_CARE_PROVIDER_SITE_OTHER): Payer: Medicaid Other | Admitting: Obstetrics & Gynecology

## 2014-10-12 VITALS — BP 117/70 | HR 92 | Temp 98.0°F | Wt 169.9 lb

## 2014-10-12 DIAGNOSIS — O24419 Gestational diabetes mellitus in pregnancy, unspecified control: Secondary | ICD-10-CM

## 2014-10-12 LAB — POCT URINALYSIS DIP (DEVICE)
BILIRUBIN URINE: NEGATIVE
Glucose, UA: 100 mg/dL — AB
Hgb urine dipstick: NEGATIVE
Ketones, ur: 40 mg/dL — AB
Leukocytes, UA: NEGATIVE
NITRITE: NEGATIVE
PH: 6 (ref 5.0–8.0)
Protein, ur: NEGATIVE mg/dL
Specific Gravity, Urine: 1.025 (ref 1.005–1.030)
Urobilinogen, UA: 0.2 mg/dL (ref 0.0–1.0)

## 2014-10-12 NOTE — Progress Notes (Signed)
OBF/NST Pt has constipation for the past week. US for growth done 12/3

## 2014-10-12 NOTE — Progress Notes (Signed)
Fastings 63-80; 2 hr pp all less than 120.  Doing well on Glyburide.  EFW starting to get large.  Follow with US closely if going to VBAC. Cont 2x week testing with delivery at 39 weeks. Discussed VBAC vs Rpt C/S.  Pt wants to discuss with husband and will tell us her decision next visit.

## 2014-10-15 ENCOUNTER — Ambulatory Visit (INDEPENDENT_AMBULATORY_CARE_PROVIDER_SITE_OTHER): Payer: Medicaid Other | Admitting: *Deleted

## 2014-10-15 VITALS — BP 110/76 | HR 108

## 2014-10-15 DIAGNOSIS — O24419 Gestational diabetes mellitus in pregnancy, unspecified control: Secondary | ICD-10-CM

## 2014-10-15 LAB — US OB FOLLOW UP

## 2014-10-15 NOTE — Addendum Note (Signed)
Addended by: Jill SideAY, Yenesis Even L on: 10/15/2014 04:20 PM   Modules accepted: Orders

## 2014-10-15 NOTE — Progress Notes (Signed)
Pt brought in signed VBAC consent - she requests repeat C/S. Also requests BTS - paper signed. .Marland Kitchen

## 2014-10-19 ENCOUNTER — Ambulatory Visit (INDEPENDENT_AMBULATORY_CARE_PROVIDER_SITE_OTHER): Payer: Medicaid Other | Admitting: Obstetrics and Gynecology

## 2014-10-19 VITALS — BP 109/63 | HR 101 | Temp 97.8°F | Wt 170.1 lb

## 2014-10-19 DIAGNOSIS — O24419 Gestational diabetes mellitus in pregnancy, unspecified control: Secondary | ICD-10-CM

## 2014-10-19 LAB — POCT URINALYSIS DIP (DEVICE)
Bilirubin Urine: NEGATIVE
Glucose, UA: NEGATIVE mg/dL
Hgb urine dipstick: NEGATIVE
KETONES UR: 15 mg/dL — AB
Nitrite: NEGATIVE
PH: 6.5 (ref 5.0–8.0)
PROTEIN: NEGATIVE mg/dL
Specific Gravity, Urine: 1.02 (ref 1.005–1.030)
UROBILINOGEN UA: 0.2 mg/dL (ref 0.0–1.0)

## 2014-10-19 NOTE — Progress Notes (Signed)
Doing well today. No concerns or complaints.  1. A2/B GDM. Reviewed log and all within goal. Continue glyburide 5 mg in the morning, 2.5 mg at night. NST reactive. Continue twice weekly antenatal testing.  2. Routine PNC. Due for 36 week labs at next visit. Plans for repeat C/S with BTL at 39 weeks.

## 2014-10-20 ENCOUNTER — Encounter: Payer: Self-pay | Admitting: *Deleted

## 2014-10-20 DIAGNOSIS — O0992 Supervision of high risk pregnancy, unspecified, second trimester: Secondary | ICD-10-CM

## 2014-10-22 ENCOUNTER — Ambulatory Visit (INDEPENDENT_AMBULATORY_CARE_PROVIDER_SITE_OTHER): Payer: Medicaid Other | Admitting: *Deleted

## 2014-10-22 VITALS — BP 109/64 | HR 89

## 2014-10-22 DIAGNOSIS — O24419 Gestational diabetes mellitus in pregnancy, unspecified control: Secondary | ICD-10-CM

## 2014-10-22 LAB — US OB FOLLOW UP

## 2014-10-22 NOTE — Progress Notes (Signed)
12/17 NST reviewed and reactive

## 2014-10-26 ENCOUNTER — Ambulatory Visit (HOSPITAL_COMMUNITY)
Admission: RE | Admit: 2014-10-26 | Discharge: 2014-10-26 | Disposition: A | Discharge status: Home / Self Care | Payer: Medicaid Other | Source: Ambulatory Visit | Attending: Obstetrics & Gynecology | Admitting: Obstetrics & Gynecology

## 2014-10-26 ENCOUNTER — Ambulatory Visit (INDEPENDENT_AMBULATORY_CARE_PROVIDER_SITE_OTHER): Payer: Medicaid Other | Admitting: Family Medicine

## 2014-10-26 ENCOUNTER — Other Ambulatory Visit: Payer: Self-pay | Admitting: Family Medicine

## 2014-10-26 VITALS — BP 110/69 | HR 89 | Wt 171.3 lb

## 2014-10-26 DIAGNOSIS — O24419 Gestational diabetes mellitus in pregnancy, unspecified control: Secondary | ICD-10-CM | POA: Insufficient documentation

## 2014-10-26 LAB — POCT URINALYSIS DIP (DEVICE)
Bilirubin Urine: NEGATIVE
Glucose, UA: NEGATIVE mg/dL
Hgb urine dipstick: NEGATIVE
Ketones, ur: 15 mg/dL — AB
Leukocytes, UA: NEGATIVE
Nitrite: NEGATIVE
PROTEIN: NEGATIVE mg/dL
SPECIFIC GRAVITY, URINE: 1.025 (ref 1.005–1.030)
Urobilinogen, UA: 0.2 mg/dL (ref 0.0–1.0)
pH: 6 (ref 5.0–8.0)

## 2014-10-26 LAB — OB RESULTS CONSOLE GBS: STREP GROUP B AG: NEGATIVE

## 2014-10-26 LAB — OB RESULTS CONSOLE GC/CHLAMYDIA
CHLAMYDIA, DNA PROBE: NEGATIVE
Gonorrhea: NEGATIVE

## 2014-10-26 MED ORDER — DOCUSATE SODIUM 100 MG PO CAPS: 100.0000 mg | ORAL_CAPSULE | Freq: Two times a day (BID) | ORAL | Status: DC

## 2014-10-26 NOTE — Progress Notes (Signed)
BPP done today - 8/8, vtx.  US for growth and BPP scheduled 12/28.

## 2014-10-26 NOTE — Progress Notes (Signed)
NST: Category 1 tracing with baseline in 130s.  Moderate variability, multiple accelerations, no decelerations. Having constipation - colace prescribed. CBG all within range. Will schedule RLTCS with BTL. GBS, GC/CT collected today.

## 2014-10-27 DIAGNOSIS — Z3A36 36 weeks gestation of pregnancy: Secondary | ICD-10-CM | POA: Insufficient documentation

## 2014-10-27 DIAGNOSIS — O24319 Unspecified pre-existing diabetes mellitus in pregnancy, unspecified trimester: Secondary | ICD-10-CM | POA: Insufficient documentation

## 2014-10-27 LAB — GC/CHLAMYDIA PROBE AMP
CT Probe RNA: NEGATIVE
GC Probe RNA: NEGATIVE

## 2014-10-29 LAB — CULTURE, BETA STREP (GROUP B ONLY)

## 2014-11-02 ENCOUNTER — Encounter: Payer: Self-pay | Admitting: Family Medicine

## 2014-11-02 ENCOUNTER — Ambulatory Visit (INDEPENDENT_AMBULATORY_CARE_PROVIDER_SITE_OTHER): Payer: BC Managed Care – PPO | Admitting: Family Medicine

## 2014-11-02 ENCOUNTER — Ambulatory Visit (HOSPITAL_COMMUNITY)
Admission: RE | Admit: 2014-11-02 | Discharge: 2014-11-02 | Disposition: A | Discharge status: Home / Self Care | Payer: Medicaid Other | Source: Ambulatory Visit | Attending: Obstetrics & Gynecology | Admitting: Obstetrics & Gynecology

## 2014-11-02 ENCOUNTER — Telehealth: Payer: Self-pay | Admitting: *Deleted

## 2014-11-02 VITALS — BP 107/62 | HR 84 | Temp 98.4°F | Wt 172.3 lb

## 2014-11-02 DIAGNOSIS — O24419 Gestational diabetes mellitus in pregnancy, unspecified control: Secondary | ICD-10-CM

## 2014-11-02 DIAGNOSIS — E119 Type 2 diabetes mellitus without complications: Secondary | ICD-10-CM | POA: Diagnosis not present

## 2014-11-02 DIAGNOSIS — O09293 Supervision of pregnancy with other poor reproductive or obstetric history, third trimester: Secondary | ICD-10-CM | POA: Insufficient documentation

## 2014-11-02 DIAGNOSIS — O24113 Pre-existing diabetes mellitus, type 2, in pregnancy, third trimester: Secondary | ICD-10-CM | POA: Diagnosis not present

## 2014-11-02 DIAGNOSIS — O24912 Unspecified diabetes mellitus in pregnancy, second trimester: Secondary | ICD-10-CM

## 2014-11-02 DIAGNOSIS — O3421 Maternal care for scar from previous cesarean delivery: Secondary | ICD-10-CM | POA: Insufficient documentation

## 2014-11-02 DIAGNOSIS — Z3A37 37 weeks gestation of pregnancy: Secondary | ICD-10-CM | POA: Diagnosis not present

## 2014-11-02 DIAGNOSIS — O0992 Supervision of high risk pregnancy, unspecified, second trimester: Secondary | ICD-10-CM

## 2014-11-02 DIAGNOSIS — O358XX0 Maternal care for other (suspected) fetal abnormality and damage, not applicable or unspecified: Secondary | ICD-10-CM | POA: Diagnosis not present

## 2014-11-02 LAB — POCT URINALYSIS DIP (DEVICE)
Bilirubin Urine: NEGATIVE
GLUCOSE, UA: NEGATIVE mg/dL
Hgb urine dipstick: NEGATIVE
KETONES UR: NEGATIVE mg/dL
Leukocytes, UA: NEGATIVE
Nitrite: NEGATIVE
PH: 5 (ref 5.0–8.0)
Protein, ur: NEGATIVE mg/dL
Urobilinogen, UA: 0.2 mg/dL (ref 0.0–1.0)

## 2014-11-02 MED ORDER — GLYBURIDE 2.5 MG PO TABS: 2.5000 mg | ORAL_TABLET | Freq: Two times a day (BID) | ORAL | Status: DC

## 2014-11-02 NOTE — Progress Notes (Signed)
C/o lower back pain at times.

## 2014-11-02 NOTE — Progress Notes (Signed)
NST reviewed and reactive. FBS 85-118 2 hour pp 103-120 Increase glyburide to 3.75 mg at hs

## 2014-11-02 NOTE — Progress Notes (Signed)
US today for growth/BPP @ 1045.  Rpt C/S w/BTL scheduled 11/11/14.

## 2014-11-02 NOTE — Patient Instructions (Signed)
Gestational Diabetes Mellitus Gestational diabetes mellitus, often simply referred to as gestational diabetes, is a type of diabetes that some women develop during pregnancy. In gestational diabetes, the pancreas does not make enough insulin (a hormone), the cells are less responsive to the insulin that is made (insulin resistance), or both.Normally, insulin moves sugars from food into the tissue cells. The tissue cells use the sugars for energy. The lack of insulin or the lack of normal response to insulin causes excess sugars to build up in the blood instead of going into the tissue cells. As a result, high blood sugar (hyperglycemia) develops. The effect of high sugar (glucose) levels can cause many problems.  RISK FACTORS You have an increased chance of developing gestational diabetes if you have a family history of diabetes and also have one or more of the following risk factors:  A body mass index over 30 (obesity).  A previous pregnancy with gestational diabetes.  An older age at the time of pregnancy. If blood glucose levels are kept in the normal range during pregnancy, women can have a healthy pregnancy. If your blood glucose levels are not well controlled, there may be risks to you, your unborn baby (fetus), your labor and delivery, or your newborn baby.  SYMPTOMS  If symptoms are experienced, they are much like symptoms you would normally expect during pregnancy. The symptoms of gestational diabetes include:   Increased thirst (polydipsia).  Increased urination (polyuria).  Increased urination during the night (nocturia).  Weight loss. This weight loss may be rapid.  Frequent, recurring infections.  Tiredness (fatigue).  Weakness.  Vision changes, such as blurred vision.  Fruity smell to your breath.  Abdominal pain. DIAGNOSIS Diabetes is diagnosed when blood glucose levels are increased. Your blood glucose level may be checked by one or more of the following blood  tests:  A fasting blood glucose test. You will not be allowed to eat for at least 8 hours before a blood sample is taken.  A random blood glucose test. Your blood glucose is checked at any time of the day regardless of when you ate.  A hemoglobin A1c blood glucose test. A hemoglobin A1c test provides information about blood glucose control over the previous 3 months.  An oral glucose tolerance test (OGTT). Your blood glucose is measured after you have not eaten (fasted) for 1-3 hours and then after you drink a glucose-containing beverage. Since the hormones that cause insulin resistance are highest at about 24-28 weeks of a pregnancy, an OGTT is usually performed during that time. If you have risk factors for gestational diabetes, your health care provider may test you for gestational diabetes earlier than 24 weeks of pregnancy. TREATMENT   You will need to take diabetes medicine or insulin daily to keep blood glucose levels in the desired range.  You will need to match insulin dosing with exercise and healthy food choices. The treatment goal is to maintain the before-meal (preprandial), bedtime, and overnight blood glucose level at 60-99 mg/dL during pregnancy. The treatment goal is to further maintain peak after-meal blood sugar (postprandial glucose) level at 100-140 mg/dL. HOME CARE INSTRUCTIONS   Have your hemoglobin A1c level checked twice a year.  Perform daily blood glucose monitoring as directed by your health care provider. It is common to perform frequent blood glucose monitoring.  Monitor urine ketones when you are ill and as directed by your health care provider.  Take your diabetes medicine and insulin as directed by your health care provider   to maintain your blood glucose level in the desired range.  Never run out of diabetes medicine or insulin. It is needed every day.  Adjust insulin based on your intake of carbohydrates. Carbohydrates can raise blood glucose levels but  need to be included in your diet. Carbohydrates provide vitamins, minerals, and fiber which are an essential part of a healthy diet. Carbohydrates are found in fruits, vegetables, whole grains, dairy products, legumes, and foods containing added sugars.  Eat healthy foods. Alternate 3 meals with 3 snacks.  Maintain a healthy weight gain. The usual total expected weight gain varies according to your prepregnancy body mass index (BMI).  Carry a medical alert card or wear your medical alert jewelry.  Carry a 15-gram carbohydrate snack with you at all times to treat low blood glucose (hypoglycemia). Some examples of 15-gram carbohydrate snacks include:  Glucose tablets, 3 or 4.  Glucose gel, 15-gram tube.  Raisins, 2 tablespoons (24 g).  Jelly beans, 6.  Animal crackers, 8.  Fruit juice, regular soda, or low-fat milk, 4 ounces (120 mL).  Gummy treats, 9.  Recognize hypoglycemia. Hypoglycemia during pregnancy occurs with blood glucose levels of 60 mg/dL and below. The risk for hypoglycemia increases when fasting or skipping meals, during or after intense exercise, and during sleep. Hypoglycemia symptoms can include:  Tremors or shakes.  Decreased ability to concentrate.  Sweating.  Increased heart rate.  Headache.  Dry mouth.  Hunger.  Irritability.  Anxiety.  Restless sleep.  Altered speech or coordination.  Confusion.  Treat hypoglycemia promptly. If you are alert and able to safely swallow, follow the 15:15 rule:  Take 15-20 grams of rapid-acting glucose or carbohydrate. Rapid-acting options include glucose gel, glucose tablets, or 4 ounces (120 mL) of fruit juice, regular soda, or low-fat milk.  Check your blood glucose level 15 minutes after taking the glucose.  Take 15-20 grams more of glucose if the repeat blood glucose level is still 70 mg/dL or below.  Eat a meal or snack within 1 hour once blood glucose levels return to normal.  Be alert to polyuria  (excess urination) and polydipsia (excess thirst) which are early signs of hyperglycemia. An early awareness of hyperglycemia allows for prompt treatment. Treat hyperglycemia as directed by your health care provider.  Engage in at least 30 minutes of physical activity a day or as directed by your health care provider. Ten minutes of physical activity timed 30 minutes after each meal is encouraged to control postprandial blood glucose levels.  Adjust your insulin dosing and food intake as needed if you start a new exercise or sport.  Follow your sick-day plan at any time you are unable to eat or drink as usual.  Avoid tobacco and alcohol use.  Keep all follow-up visits as directed by your health care provider.  Follow the advice of your health care provider regarding your prenatal and post-delivery (postpartum) appointments, meal planning, exercise, medicines, vitamins, blood tests, other medical tests, and physical activities.  Perform daily skin and foot care. Examine your skin and feet daily for cuts, bruises, redness, nail problems, bleeding, blisters, or sores.  Brush your teeth and gums at least twice a day and floss at least once a day. Follow up with your dentist regularly.  Schedule an eye exam during the first trimester of your pregnancy or as directed by your health care provider.  Share your diabetes management plan with your workplace or school.  Stay up-to-date with immunizations.  Learn to manage stress.    Obtain ongoing diabetes education and support as needed.  Learn about and consider breastfeeding your baby.  You should have your blood sugar level checked 6-12 weeks after delivery. This is done with an oral glucose tolerance test (OGTT). SEEK MEDICAL CARE IF:   You are unable to eat food or drink fluids for more than 6 hours.  You have nausea and vomiting for more than 6 hours.  You have a blood glucose level of 200 mg/dL and you have ketones in your  urine.  There is a change in mental status.  You develop vision problems.  You have a persistent headache.  You have upper abdominal pain or discomfort.  You develop an additional serious illness.  You have diarrhea for more than 6 hours.  You have been sick or have had a fever for a couple of days and are not getting better. SEEK IMMEDIATE MEDICAL CARE IF:   You have difficulty breathing.  You no longer feel the baby moving.  You are bleeding or have discharge from your vagina.  You start having premature contractions or labor. MAKE SURE YOU:  Understand these instructions.  Will watch your condition.  Will get help right away if you are not doing well or get worse. Document Released: 01/29/2001 Document Revised: 03/09/2014 Document Reviewed: 05/21/2012 [employer] Patient Information 2015 [employer]. This information is not intended to replace advice given to you by your health care provider. Make sure you discuss any questions you have with your health care provider.  Breastfeeding Deciding to breastfeed is one of the best choices you can make for you and your baby. A change in hormones during pregnancy causes your breast tissue to grow and increases the number and size of your milk ducts. These hormones also allow proteins, sugars, and fats from your blood supply to make breast milk in your milk-producing glands. Hormones prevent breast milk from being released before your baby is born as well as prompt milk flow after birth. Once breastfeeding has begun, thoughts of your baby, as well as his or her sucking or crying, can stimulate the release of milk from your milk-producing glands.  BENEFITS OF BREASTFEEDING For Your Baby  Your first milk (colostrum) helps your baby's digestive system function better.   There are antibodies in your milk that help your baby fight off infections.   Your baby has a lower incidence of asthma, allergies, and sudden infant death  syndrome.   The nutrients in breast milk are better for your baby than infant formulas and are designed uniquely for your baby's needs.   Breast milk improves your baby's brain development.   Your baby is less likely to develop other conditions, such as childhood obesity, asthma, or type 2 diabetes mellitus.  For You   Breastfeeding helps to create a very special bond between you and your baby.   Breastfeeding is convenient. Breast milk is always available at the correct temperature and costs nothing.   Breastfeeding helps to burn calories and helps you lose the weight gained during pregnancy.   Breastfeeding makes your uterus contract to its prepregnancy size faster and slows bleeding (lochia) after you give birth.   Breastfeeding helps to lower your risk of developing type 2 diabetes mellitus, osteoporosis, and breast or ovarian cancer later in life. SIGNS THAT YOUR BABY IS HUNGRY Early Signs of Hunger  Increased alertness or activity.  Stretching.  Movement of the head from side to side.  Movement of the head and opening of the   mouth when the corner of the mouth or cheek is stroked (rooting).  Increased sucking sounds, smacking lips, cooing, sighing, or squeaking.  Hand-to-mouth movements.  Increased sucking of fingers or hands. Late Signs of Hunger  Fussing.  Intermittent crying. Extreme Signs of Hunger Signs of extreme hunger will require calming and consoling before your baby will be able to breastfeed successfully. Do not wait for the following signs of extreme hunger to occur before you initiate breastfeeding:   Restlessness.  A loud, strong cry.   Screaming. BREASTFEEDING BASICS Breastfeeding Initiation  Find a comfortable place to sit or lie down, with your neck and back well supported.  Place a pillow or rolled up blanket under your baby to bring him or her to the level of your breast (if you are seated). Nursing pillows are specially designed  to help support your arms and your baby while you breastfeed.  Make sure that your baby's abdomen is facing your abdomen.   Gently massage your breast. With your fingertips, massage from your chest wall toward your nipple in a circular motion. This encourages milk flow. You may need to continue this action during the feeding if your milk flows slowly.  Support your breast with 4 fingers underneath and your thumb above your nipple. Make sure your fingers are well away from your nipple and your baby's mouth.   Stroke your baby's lips gently with your finger or nipple.   When your baby's mouth is open wide enough, quickly bring your baby to your breast, placing your entire nipple and as much of the colored area around your nipple (areola) as possible into your baby's mouth.   More areola should be visible above your baby's upper lip than below the lower lip.   Your baby's tongue should be between his or her lower gum and your breast.   Ensure that your baby's mouth is correctly positioned around your nipple (latched). Your baby's lips should create a seal on your breast and be turned out (everted).  It is common for your baby to suck about 2-3 minutes in order to start the flow of breast milk. Latching Teaching your baby how to latch on to your breast properly is very important. An improper latch can cause nipple pain and decreased milk supply for you and poor weight gain in your baby. Also, if your baby is not latched onto your nipple properly, he or she may swallow some air during feeding. This can make your baby fussy. Burping your baby when you switch breasts during the feeding can help to get rid of the air. However, teaching your baby to latch on properly is still the best way to prevent fussiness from swallowing air while breastfeeding. Signs that your baby has successfully latched on to your nipple:    Silent tugging or silent sucking, without causing you pain.   Swallowing  heard between every 3-4 sucks.    Muscle movement above and in front of his or her ears while sucking.  Signs that your baby has not successfully latched on to nipple:   Sucking sounds or smacking sounds from your baby while breastfeeding.  Nipple pain. If you think your baby has not latched on correctly, slip your finger into the corner of your baby's mouth to break the suction and place it between your baby's gums. Attempt breastfeeding initiation again. Signs of Successful Breastfeeding Signs from your baby:   A gradual decrease in the number of sucks or complete cessation of sucking.     Falling asleep.   Relaxation of his or her body.   Retention of a small amount of milk in his or her mouth.   Letting go of your breast by himself or herself. Signs from you:  Breasts that have increased in firmness, weight, and size 1-3 hours after feeding.   Breasts that are softer immediately after breastfeeding.  Increased milk volume, as well as a change in milk consistency and color by the fifth day of breastfeeding.   Nipples that are not sore, cracked, or bleeding. Signs That Your Baby is Getting Enough Milk  Wetting at least 3 diapers in a 24-hour period. The urine should be clear and pale yellow by age 5 days.  At least 3 stools in a 24-hour period by age 5 days. The stool should be soft and yellow.  At least 3 stools in a 24-hour period by age 7 days. The stool should be seedy and yellow.  No loss of weight greater than 10% of birth weight during the first 3 days of age.  Average weight gain of 4-7 ounces (113-198 g) per week after age 4 days.  Consistent daily weight gain by age 5 days, without weight loss after the age of 2 weeks. After a feeding, your baby may spit up a small amount. This is common. BREASTFEEDING FREQUENCY AND DURATION Frequent feeding will help you make more milk and can prevent sore nipples and breast engorgement. Breastfeed when you feel the  need to reduce the fullness of your breasts or when your baby shows signs of hunger. This is called "breastfeeding on demand." Avoid introducing a pacifier to your baby while you are working to establish breastfeeding (the first 4-6 weeks after your baby is born). After this time you may choose to use a pacifier. Research has shown that pacifier use during the first year of a baby's life decreases the risk of sudden infant death syndrome (SIDS). Allow your baby to feed on each breast as long as he or she wants. Breastfeed until your baby is finished feeding. When your baby unlatches or falls asleep while feeding from the first breast, offer the second breast. Because newborns are often sleepy in the first few weeks of life, you may need to awaken your baby to get him or her to feed. Breastfeeding times will vary from baby to baby. However, the following rules can serve as a guide to help you ensure that your baby is properly fed:  Newborns (babies 4 weeks of age or younger) may breastfeed every 1-3 hours.  Newborns should not go longer than 3 hours during the day or 5 hours during the night without breastfeeding.  You should breastfeed your baby a minimum of 8 times in a 24-hour period until you begin to introduce solid foods to your baby at around 6 months of age. BREAST MILK PUMPING Pumping and storing breast milk allows you to ensure that your baby is exclusively fed your breast milk, even at times when you are unable to breastfeed. This is especially important if you are going back to work while you are still breastfeeding or when you are not able to be present during feedings. Your lactation consultant can give you guidelines on how long it is safe to store breast milk.  A breast pump is a machine that allows you to pump milk from your breast into a sterile bottle. The pumped breast milk can then be stored in a refrigerator or freezer. Some breast pumps are operated by   hand, while others use  electricity. Ask your lactation consultant which type will work best for you. Breast pumps can be purchased, but some hospitals and breastfeeding support groups lease breast pumps on a monthly basis. A lactation consultant can teach you how to hand express breast milk, if you prefer not to use a pump.  CARING FOR YOUR BREASTS WHILE YOU BREASTFEED Nipples can become dry, cracked, and sore while breastfeeding. The following recommendations can help keep your breasts moisturized and healthy:  Avoid using soap on your nipples.   Wear a supportive bra. Although not required, special nursing bras and tank tops are designed to allow access to your breasts for breastfeeding without taking off your entire bra or top. Avoid wearing underwire-style bras or extremely tight bras.  Air dry your nipples for 3-4minutes after each feeding.   Use only cotton bra pads to absorb leaked breast milk. Leaking of breast milk between feedings is normal.   Use lanolin on your nipples after breastfeeding. Lanolin helps to maintain your skin's normal moisture barrier. If you use pure lanolin, you do not need to wash it off before feeding your baby again. Pure lanolin is not toxic to your baby. You may also hand express a few drops of breast milk and gently massage that milk into your nipples and allow the milk to air dry. In the first few weeks after giving birth, some women experience extremely full breasts (engorgement). Engorgement can make your breasts feel heavy, warm, and tender to the touch. Engorgement peaks within 3-5 days after you give birth. The following recommendations can help ease engorgement:  Completely empty your breasts while breastfeeding or pumping. You may want to start by applying warm, moist heat (in the shower or with warm water-soaked hand towels) just before feeding or pumping. This increases circulation and helps the milk flow. If your baby does not completely empty your breasts while  breastfeeding, pump any extra milk after he or she is finished.  Wear a snug bra (nursing or regular) or tank top for 1-2 days to signal your body to slightly decrease milk production.  Apply ice packs to your breasts, unless this is too uncomfortable for you.  Make sure that your baby is latched on and positioned properly while breastfeeding. If engorgement persists after 48 hours of following these recommendations, contact your health care provider or a lactation consultant. OVERALL HEALTH CARE RECOMMENDATIONS WHILE BREASTFEEDING  Eat healthy foods. Alternate between meals and snacks, eating 3 of each per day. Because what you eat affects your breast milk, some of the foods may make your baby more irritable than usual. Avoid eating these foods if you are sure that they are negatively affecting your baby.  Drink milk, fruit juice, and water to satisfy your thirst (about 10 glasses a day).   Rest often, relax, and continue to take your prenatal vitamins to prevent fatigue, stress, and anemia.  Continue breast self-awareness checks.  Avoid chewing and smoking tobacco.  Avoid alcohol and drug use. Some medicines that may be harmful to your baby can pass through breast milk. It is important to ask your health care provider before taking any medicine, including all over-the-counter and prescription medicine as well as vitamin and herbal supplements. It is possible to become pregnant while breastfeeding. If birth control is desired, ask your health care provider about options that will be safe for your baby. SEEK MEDICAL CARE IF:   You feel like you want to stop breastfeeding or have become   frustrated with breastfeeding.  You have painful breasts or nipples.  Your nipples are cracked or bleeding.  Your breasts are red, tender, or warm.  You have a swollen area on either breast.  You have a fever or chills.  You have nausea or vomiting.  You have drainage other than breast milk from  your nipples.  Your breasts do not become full before feedings by the fifth day after you give birth.  You feel sad and depressed.  Your baby is too sleepy to eat well.  Your baby is having trouble sleeping.   Your baby is wetting less than 3 diapers in a 24-hour period.  Your baby has less than 3 stools in a 24-hour period.  Your baby's skin or the white part of his or her eyes becomes yellow.   Your baby is not gaining weight by 275 days of age. SEEK IMMEDIATE MEDICAL CARE IF:   Your baby is overly tired (lethargic) and does not want to wake up and feed.  Your baby develops an unexplained fever. Document Released: 10/23/2005 Document Revised: 10/28/2013 Document Reviewed: 04/16/2013 [facility] Patient Information 2015 [place], [place]. This information is not intended to replace advice given to you by your health care provider. Make sure you discuss any questions you have with your health care provider.  Vaginal Birth After Cesarean Delivery Vaginal birth after cesarean delivery (VBAC) is giving birth vaginally after previously delivering a baby by a cesarean. In the past, if a woman had a cesarean delivery, all births afterward would be done by cesarean delivery. This is no longer true. It can be safe for the mother to try a vaginal delivery after having a cesarean delivery.  It is important to discuss VBAC with your health care provider early in the pregnancy so you can understand the risks, benefits, and options. It will give you time to decide what is best in your particular case. The final decision about whether to have a VBAC or repeat cesarean delivery should be between you and your health care provider. Any changes in your health or your baby's health during your pregnancy may make it necessary to change your initial decision about VBAC.  WOMEN WHO PLAN TO HAVE A VBAC SHOULD CHECK WITH THEIR HEALTH CARE PROVIDER TO BE SURE THAT:  The previous cesarean delivery was done with a low  transverse uterine cut (incision) (not a vertical classical incision).   The birth canal is big enough for the baby.   There were no other operations on the uterus.   An electronic fetal monitor (EFM) will be on at all times during labor.   An operating room will be available and ready in case an emergency cesarean delivery is needed.   A health care provider and surgical nursing staff will be available at all times during labor to be ready to do an emergency delivery cesarean if necessary.   An anesthesiologist will be present in case an emergency cesarean delivery is needed.   The nursery is prepared and has adequate personnel and necessary equipment available to care for the baby in case of an emergency cesarean delivery. BENEFITS OF VBAC  Shorter stay in the hospital.   Avoidance of risks associated with cesarean delivery, such as:  Surgical complications, such as opening of the incision or hernia in the incision.  Injury to other organs.  Fever. This can occur if an infection develops after surgery. It can also occur as a reaction to the medicine given to make  you numb during the surgery.  Less blood loss and need for blood transfusions.  Lower risk of blood clots and infection.  Shorter recovery.   Decreased risk for having to remove the uterus (hysterectomy).   Decreased risk for the placenta to completely or partially cover the opening of the uterus (placenta previa) with a future pregnancy.   Decrease risk in future labor and delivery. RISKS OF A VBAC  Tearing (rupture) of the uterus. This is occurs in less than 1% of VBACs. The risk of this happening is higher if:  Steps are taken to begin the labor process (induce labor) or stimulate or strengthen contractions (augment labor).   Medicine is used to soften (ripen) the cervix.  Having to remove the uterus (hysterectomy) if it ruptures. VBAC SHOULD NOT BE DONE IF:  The previous cesarean delivery  was done with a vertical (classical) or T-shaped incision or you do not know what kind of incision was made.   You had a ruptured uterus.   You have had certain types of surgery on your uterus, such as removal of uterine fibroids. Ask your health care provider about other types of surgeries that prevent you from having a VBAC.  You have certain medical or childbirth (obstetrical) problems.   There are problems with the baby.   You have had two previous cesarean deliveries and no vaginal deliveries. OTHER FACTS TO KNOW ABOUT VBAC:  It is safe to have an epidural anesthetic with VBAC.   It is safe to turn the baby from a breech position (attempt an external cephalic version).   It is safe to try a VBAC with twins.   VBAC may not be successful if your baby weights 8.8 lb (4 kg) or more. However, weight predictions are not always accurate and should not be used alone to decide if VBAC is right for you.  There is an increased failure rate if the time between the cesarean delivery and VBAC is less than 19 months.   Your health care provider may advise against a VBAC if you have preeclampsia (high blood pressure, protein in the urine, and swelling of face and extremities).   VBAC is often successful if you previously gave birth vaginally.   VBAC is often successful when the labor starts spontaneously before the due date.   Delivering a baby through a VBAC is similar to having a normal spontaneous vaginal delivery. Document Released: 04/15/2007 Document Revised: 03/09/2014 Document Reviewed: 05/22/2013 [facility] Patient Information 2015 [employer] [place], [place]. This information is not intended to replace advice given to you by your health care provider. Make sure you discuss any questions you have with your health care provider.

## 2014-11-02 NOTE — Progress Notes (Signed)
Per review of results- Called [facility]- patient did not keep her referral appointment.

## 2014-11-02 NOTE — Telephone Encounter (Signed)
Called [facility]- Dr. Elizebeth Brookingotton to find out if patient kept 2d echo appointment- which she did- they will fax result.s

## 2014-11-09 ENCOUNTER — Encounter: Payer: Self-pay | Admitting: *Deleted

## 2014-11-09 ENCOUNTER — Encounter (HOSPITAL_COMMUNITY): Payer: Self-pay

## 2014-11-09 ENCOUNTER — Ambulatory Visit (INDEPENDENT_AMBULATORY_CARE_PROVIDER_SITE_OTHER): Payer: Medicaid Other | Admitting: Family Medicine

## 2014-11-09 ENCOUNTER — Encounter (HOSPITAL_COMMUNITY)
Admission: RE | Admit: 2014-11-09 | Discharge: 2014-11-09 | Disposition: A | Discharge status: Home / Self Care | Payer: BLUE CROSS/BLUE SHIELD | Source: Ambulatory Visit | Attending: Family Medicine | Admitting: Family Medicine

## 2014-11-09 VITALS — BP 105/68 | HR 96 | Temp 98.6°F | Wt 172.0 lb

## 2014-11-09 VITALS — Ht 59.0 in | Wt 172.0 lb

## 2014-11-09 DIAGNOSIS — O34219 Maternal care for unspecified type scar from previous cesarean delivery: Secondary | ICD-10-CM

## 2014-11-09 DIAGNOSIS — O24913 Unspecified diabetes mellitus in pregnancy, third trimester: Secondary | ICD-10-CM

## 2014-11-09 DIAGNOSIS — O3421 Maternal care for scar from previous cesarean delivery: Secondary | ICD-10-CM

## 2014-11-09 DIAGNOSIS — O24313 Unspecified pre-existing diabetes mellitus in pregnancy, third trimester: Secondary | ICD-10-CM

## 2014-11-09 DIAGNOSIS — O24419 Gestational diabetes mellitus in pregnancy, unspecified control: Secondary | ICD-10-CM

## 2014-11-09 DIAGNOSIS — O283 Abnormal ultrasonic finding on antenatal screening of mother: Secondary | ICD-10-CM

## 2014-11-09 LAB — CBC
HEMATOCRIT: 37.5 % (ref 36.0–46.0)
HEMOGLOBIN: 12.2 g/dL (ref 12.0–15.0)
MCH: 25.7 pg — ABNORMAL LOW (ref 26.0–34.0)
MCHC: 32.5 g/dL (ref 30.0–36.0)
MCV: 78.9 fL (ref 78.0–100.0)
Platelets: 114 10*3/uL — ABNORMAL LOW (ref 150–400)
RBC: 4.75 MIL/uL (ref 3.87–5.11)
RDW: 15.6 % — AB (ref 11.5–15.5)
WBC: 7.7 10*3/uL (ref 4.0–10.5)

## 2014-11-09 LAB — POCT URINALYSIS DIP (DEVICE)
BILIRUBIN URINE: NEGATIVE
Glucose, UA: NEGATIVE mg/dL
HGB URINE DIPSTICK: NEGATIVE
KETONES UR: NEGATIVE mg/dL
Leukocytes, UA: NEGATIVE
Nitrite: NEGATIVE
PH: 6.5 (ref 5.0–8.0)
Protein, ur: NEGATIVE mg/dL
Specific Gravity, Urine: 1.02 (ref 1.005–1.030)
Urobilinogen, UA: 0.2 mg/dL (ref 0.0–1.0)

## 2014-11-09 LAB — BASIC METABOLIC PANEL
Anion gap: 8 (ref 5–15)
BUN: 7 mg/dL (ref 6–23)
CHLORIDE: 107 meq/L (ref 96–112)
CO2: 20 mmol/L (ref 19–32)
Calcium: 9 mg/dL (ref 8.4–10.5)
Creatinine, Ser: 0.63 mg/dL (ref 0.50–1.10)
GFR calc non Af Amer: >90 mL/min (ref >90)
Glucose, Bld: 171 mg/dL — ABNORMAL HIGH (ref 70–99)
POTASSIUM: 3.9 mmol/L (ref 3.5–5.1)
Sodium: 135 mmol/L (ref 135–145)

## 2014-11-09 LAB — TYPE AND SCREEN
ABO/RH(D): O POS
Antibody Screen: NEGATIVE

## 2014-11-09 LAB — ABO/RH: ABO/RH(D): O POS

## 2014-11-09 LAB — RPR

## 2014-11-09 NOTE — Progress Notes (Signed)
Repeat C/S on 1/6.

## 2014-11-09 NOTE — Progress Notes (Signed)
Dr Malen Gauze aware of PLT 114.Marland Kitchen WILL RE-DRAW DAY OF SURGERY

## 2014-11-09 NOTE — Progress Notes (Signed)
NST reactive CBGs well controlled.  RLTCS with BTL scheduled on Wednesday Labor precautions given.

## 2014-11-09 NOTE — Pre-Procedure Instructions (Signed)
   Your procedure is scheduled on: JAN 6 AT 930AM  Enter through the Main Entrance of [facility] at:8AM Pick up the phone at the desk and dial [phone] and inform us of your arrival.  Please call this number if you have any problems the morning of surgery: [phone]  Remember: Do not eat food after midnight:JAN 5 Do not drink clear liquids after: JAN 5 Take these medicines the morning of surgery with a SIP OF WATER:  Do not wear jewelry, make-up, or FINGER nail polish No metal in your hair or on your body. Do not wear lotions, powders, perfumes.  You may wear deodorant.  Do not bring valuables to the hospital. Contacts, dentures or bridgework may not be worn into surgery.  Leave suitcase in the car. After Surgery it may be brought to your room. For patients being admitted to the hospital, checkout time is 11:00am the day of discharge.    Patients discharged on the day of surgery will not be allowed to drive home.

## 2014-11-09 NOTE — Patient Instructions (Addendum)
   Your procedure is scheduled on:JAN 6 AT 930  Enter through the Main Entrance of [facility] at:8AM Pick up the phone at the desk and dial [phone] and inform us of your arrival.  Please call this number if you have any problems the morning of surgery: [phone]  Remember: Do not eat food after midnight: Do not drink clear liquids after: Take these medicines the morning of surgery with a SIP OF WATER:  Do not wear jewelry, make-up, or FINGER nail polish No metal in your hair or on your body. Do not wear lotions, powders, perfumes.  You may wear deodorant.  Do not bring valuables to the hospital. Contacts, dentures or bridgework may not be worn into surgery.  Leave suitcase in the car. After Surgery it may be brought to your room. For patients being admitted to the hospital, checkout time is 11:00am the day of discharge.    Patients discharged on the day of surgery will not be allowed to drive home.

## 2014-11-10 MED ORDER — CEFAZOLIN SODIUM-DEXTROSE 2-3 GM-% IV SOLR
2.0000 g | INTRAVENOUS | Status: AC
  Administered 2014-11-11: 2 g via INTRAVENOUS

## 2014-11-11 ENCOUNTER — Encounter (HOSPITAL_COMMUNITY): Payer: Self-pay

## 2014-11-11 ENCOUNTER — Inpatient Hospital Stay (HOSPITAL_COMMUNITY): Payer: BLUE CROSS/BLUE SHIELD | Admitting: Anesthesiology

## 2014-11-11 ENCOUNTER — Inpatient Hospital Stay (HOSPITAL_COMMUNITY)
Admission: RE | Admit: 2014-11-11 | Discharge: 2014-11-14 | DRG: 765 | Disposition: A | Discharge status: Home / Self Care | Payer: BLUE CROSS/BLUE SHIELD | Source: Ambulatory Visit | Attending: Obstetrics & Gynecology | Admitting: Obstetrics & Gynecology

## 2014-11-11 ENCOUNTER — Encounter (HOSPITAL_COMMUNITY)
Admission: RE | Disposition: A | Discharge status: Home / Self Care | Payer: Self-pay | Source: Ambulatory Visit | Attending: Family Medicine

## 2014-11-11 DIAGNOSIS — Z7982 Long term (current) use of aspirin: Secondary | ICD-10-CM | POA: Diagnosis not present

## 2014-11-11 DIAGNOSIS — Z3490 Encounter for supervision of normal pregnancy, unspecified, unspecified trimester: Secondary | ICD-10-CM

## 2014-11-11 DIAGNOSIS — O24419 Gestational diabetes mellitus in pregnancy, unspecified control: Secondary | ICD-10-CM

## 2014-11-11 DIAGNOSIS — O3421 Maternal care for scar from previous cesarean delivery: Principal | ICD-10-CM | POA: Diagnosis present

## 2014-11-11 DIAGNOSIS — Z302 Encounter for sterilization: Secondary | ICD-10-CM | POA: Diagnosis not present

## 2014-11-11 DIAGNOSIS — Z3A39 39 weeks gestation of pregnancy: Secondary | ICD-10-CM | POA: Diagnosis present

## 2014-11-11 DIAGNOSIS — O34219 Maternal care for unspecified type scar from previous cesarean delivery: Secondary | ICD-10-CM

## 2014-11-11 DIAGNOSIS — Z79899 Other long term (current) drug therapy: Secondary | ICD-10-CM | POA: Diagnosis not present

## 2014-11-11 DIAGNOSIS — O2412 Pre-existing diabetes mellitus, type 2, in childbirth: Secondary | ICD-10-CM | POA: Diagnosis present

## 2014-11-11 DIAGNOSIS — E119 Type 2 diabetes mellitus without complications: Secondary | ICD-10-CM | POA: Diagnosis present

## 2014-11-11 DIAGNOSIS — O283 Abnormal ultrasonic finding on antenatal screening of mother: Secondary | ICD-10-CM

## 2014-11-11 LAB — PLATELET COUNT: Platelets: 117 10*3/uL — ABNORMAL LOW (ref 150–400)

## 2014-11-11 LAB — GLUCOSE, CAPILLARY
GLUCOSE-CAPILLARY: 70 mg/dL (ref 70–99)
GLUCOSE-CAPILLARY: 100 mg/dL — AB (ref 70–99)

## 2014-11-11 SURGERY — Surgical Case
Anesthesia: Spinal | Site: Abdomen

## 2014-11-11 MED ORDER — NALBUPHINE HCL 10 MG/ML IJ SOLN: 5.0000 mg | Freq: Once | INTRAMUSCULAR | Status: AC | PRN

## 2014-11-11 MED ORDER — MENTHOL 3 MG MT LOZG: 1.0000 | LOZENGE | OROMUCOSAL | Status: DC | PRN

## 2014-11-11 MED ORDER — NALOXONE HCL 0.4 MG/ML IJ SOLN: 0.4000 mg | INTRAMUSCULAR | Status: DC | PRN

## 2014-11-11 MED ORDER — FENTANYL CITRATE 0.05 MG/ML IJ SOLN: 25.0000 ug | INTRAMUSCULAR | Status: DC | PRN

## 2014-11-11 MED ORDER — MEPERIDINE HCL 25 MG/ML IJ SOLN
6.2500 mg | INTRAMUSCULAR | Status: DC | PRN
Start: 2014-11-11 — End: 2014-11-11

## 2014-11-11 MED ORDER — PHENYLEPHRINE 8 MG IN D5W 100 ML (0.08MG/ML) PREMIX OPTIME
INJECTION | INTRAVENOUS | Status: DC | PRN
  Administered 2014-11-11: 60 ug/min via INTRAVENOUS

## 2014-11-11 MED ORDER — DIPHENHYDRAMINE HCL 25 MG PO CAPS: 25.0000 mg | ORAL_CAPSULE | Freq: Four times a day (QID) | ORAL | Status: DC | PRN

## 2014-11-11 MED ORDER — NALBUPHINE HCL 10 MG/ML IJ SOLN: 5.0000 mg | INTRAMUSCULAR | Status: DC | PRN

## 2014-11-11 MED ORDER — SIMETHICONE 80 MG PO CHEW: 80.0000 mg | CHEWABLE_TABLET | ORAL | Status: DC | PRN

## 2014-11-11 MED ORDER — FENTANYL CITRATE 0.05 MG/ML IJ SOLN
INTRAMUSCULAR | Status: DC | PRN
Start: 2014-11-11 — End: 2014-11-11
  Administered 2014-11-11: 25 ug via INTRATHECAL

## 2014-11-11 MED ORDER — ONDANSETRON HCL 4 MG/2ML IJ SOLN
INTRAMUSCULAR | Status: AC
  Filled 2014-11-11: qty 2

## 2014-11-11 MED ORDER — ACETAMINOPHEN 500 MG PO TABS
1000.0000 mg | ORAL_TABLET | Freq: Four times a day (QID) | ORAL | Status: AC
  Administered 2014-11-11 – 2014-11-12 (×3): 1000 mg via ORAL
  Filled 2014-11-11 (×3): qty 2

## 2014-11-11 MED ORDER — SODIUM CHLORIDE 0.9 % IJ SOLN: 3.0000 mL | INTRAMUSCULAR | Status: DC | PRN

## 2014-11-11 MED ORDER — DIPHENHYDRAMINE HCL 25 MG PO CAPS: 25.0000 mg | ORAL_CAPSULE | ORAL | Status: DC | PRN

## 2014-11-11 MED ORDER — BUPIVACAINE HCL (PF) 0.25 % IJ SOLN
INTRAMUSCULAR | Status: AC
  Filled 2014-11-11: qty 30

## 2014-11-11 MED ORDER — IBUPROFEN 600 MG PO TABS
600.0000 mg | ORAL_TABLET | Freq: Four times a day (QID) | ORAL | Status: DC | PRN
  Administered 2014-11-12 – 2014-11-13 (×2): 600 mg via ORAL

## 2014-11-11 MED ORDER — MORPHINE SULFATE 0.5 MG/ML IJ SOLN
INTRAMUSCULAR | Status: AC
  Filled 2014-11-11: qty 10

## 2014-11-11 MED ORDER — TETANUS-DIPHTH-ACELL PERTUSSIS 5-2.5-18.5 LF-MCG/0.5 IM SUSP: 0.5000 mL | Freq: Once | INTRAMUSCULAR | Status: DC

## 2014-11-11 MED ORDER — SCOPOLAMINE 1 MG/3DAYS TD PT72
MEDICATED_PATCH | TRANSDERMAL | Status: AC
  Administered 2014-11-11: 1.5 mg via TRANSDERMAL
  Filled 2014-11-11: qty 1

## 2014-11-11 MED ORDER — MORPHINE SULFATE (PF) 0.5 MG/ML IJ SOLN
INTRAMUSCULAR | Status: DC | PRN
  Administered 2014-11-11: .15 mg via INTRATHECAL

## 2014-11-11 MED ORDER — LACTATED RINGERS IV SOLN: INTRAVENOUS | Status: DC

## 2014-11-11 MED ORDER — DIPHENHYDRAMINE HCL 50 MG/ML IJ SOLN: 12.5000 mg | INTRAMUSCULAR | Status: DC | PRN

## 2014-11-11 MED ORDER — OXYTOCIN 10 UNIT/ML IJ SOLN
40.0000 [IU] | INTRAVENOUS | Status: DC | PRN
  Administered 2014-11-11: 40 [IU] via INTRAVENOUS

## 2014-11-11 MED ORDER — CEFAZOLIN SODIUM-DEXTROSE 2-3 GM-% IV SOLR
INTRAVENOUS | Status: AC
  Filled 2014-11-11: qty 50

## 2014-11-11 MED ORDER — ACETAMINOPHEN 325 MG PO TABS: 325.0000 mg | ORAL_TABLET | ORAL | Status: DC | PRN

## 2014-11-11 MED ORDER — OXYTOCIN 10 UNIT/ML IJ SOLN
INTRAMUSCULAR | Status: AC
  Filled 2014-11-11: qty 4

## 2014-11-11 MED ORDER — LANOLIN HYDROUS EX OINT: 1.0000 "application " | TOPICAL_OINTMENT | CUTANEOUS | Status: DC | PRN

## 2014-11-11 MED ORDER — OXYTOCIN 40 UNITS IN LACTATED RINGERS INFUSION - SIMPLE MED: 62.5000 mL/h | INTRAVENOUS | Status: AC

## 2014-11-11 MED ORDER — PROMETHAZINE HCL 25 MG/ML IJ SOLN: 6.2500 mg | INTRAMUSCULAR | Status: DC | PRN

## 2014-11-11 MED ORDER — ONDANSETRON HCL 4 MG/2ML IJ SOLN: 4.0000 mg | INTRAMUSCULAR | Status: DC | PRN

## 2014-11-11 MED ORDER — ONDANSETRON HCL 4 MG PO TABS: 4.0000 mg | ORAL_TABLET | ORAL | Status: DC | PRN

## 2014-11-11 MED ORDER — PHENYLEPHRINE HCL 10 MG/ML IJ SOLN
INTRAMUSCULAR | Status: DC | PRN
  Administered 2014-11-11: 80 ug via INTRAVENOUS

## 2014-11-11 MED ORDER — ONDANSETRON HCL 4 MG/2ML IJ SOLN: 4.0000 mg | Freq: Three times a day (TID) | INTRAMUSCULAR | Status: DC | PRN

## 2014-11-11 MED ORDER — OXYCODONE-ACETAMINOPHEN 5-325 MG PO TABS: 2.0000 | ORAL_TABLET | ORAL | Status: DC | PRN

## 2014-11-11 MED ORDER — BUPIVACAINE HCL (PF) 0.25 % IJ SOLN
INTRAMUSCULAR | Status: DC | PRN
  Administered 2014-11-11: 30 mL

## 2014-11-11 MED ORDER — MIDAZOLAM HCL 2 MG/2ML IJ SOLN
0.5000 mg | Freq: Once | INTRAMUSCULAR | Status: DC | PRN
Start: 2014-11-11 — End: 2014-11-11

## 2014-11-11 MED ORDER — IBUPROFEN 600 MG PO TABS
600.0000 mg | ORAL_TABLET | Freq: Four times a day (QID) | ORAL | Status: DC
  Administered 2014-11-12 – 2014-11-14 (×10): 600 mg via ORAL
  Filled 2014-11-11 (×11): qty 1

## 2014-11-11 MED ORDER — SENNOSIDES-DOCUSATE SODIUM 8.6-50 MG PO TABS
2.0000 | ORAL_TABLET | ORAL | Status: DC
  Administered 2014-11-12 – 2014-11-13 (×3): 2 via ORAL
  Filled 2014-11-11 (×3): qty 2

## 2014-11-11 MED ORDER — DIBUCAINE 1 % RE OINT: 1.0000 "application " | TOPICAL_OINTMENT | RECTAL | Status: DC | PRN

## 2014-11-11 MED ORDER — ZOLPIDEM TARTRATE 5 MG PO TABS: 5.0000 mg | ORAL_TABLET | Freq: Every evening | ORAL | Status: DC | PRN

## 2014-11-11 MED ORDER — PHENYLEPHRINE 8 MG IN D5W 100 ML (0.08MG/ML) PREMIX OPTIME
INJECTION | INTRAVENOUS | Status: AC
  Filled 2014-11-11: qty 100

## 2014-11-11 MED ORDER — SIMETHICONE 80 MG PO CHEW
80.0000 mg | CHEWABLE_TABLET | ORAL | Status: DC
  Administered 2014-11-12 – 2014-11-13 (×3): 80 mg via ORAL
  Filled 2014-11-11 (×3): qty 1

## 2014-11-11 MED ORDER — ONDANSETRON HCL 4 MG/2ML IJ SOLN
INTRAMUSCULAR | Status: DC | PRN
  Administered 2014-11-11: 4 mg via INTRAVENOUS

## 2014-11-11 MED ORDER — LACTATED RINGERS IV SOLN
INTRAVENOUS | Status: DC
  Administered 2014-11-11 (×4): via INTRAVENOUS

## 2014-11-11 MED ORDER — MEPERIDINE HCL 25 MG/ML IJ SOLN: 6.2500 mg | INTRAMUSCULAR | Status: DC | PRN

## 2014-11-11 MED ORDER — OXYCODONE-ACETAMINOPHEN 5-325 MG PO TABS
1.0000 | ORAL_TABLET | ORAL | Status: DC | PRN
  Administered 2014-11-13 – 2014-11-14 (×4): 1 via ORAL
  Filled 2014-11-11 (×4): qty 1

## 2014-11-11 MED ORDER — PHENYLEPHRINE 40 MCG/ML (10ML) SYRINGE FOR IV PUSH (FOR BLOOD PRESSURE SUPPORT)
PREFILLED_SYRINGE | INTRAVENOUS | Status: AC
  Filled 2014-11-11: qty 10

## 2014-11-11 MED ORDER — SCOPOLAMINE 1 MG/3DAYS TD PT72
1.0000 | MEDICATED_PATCH | Freq: Once | TRANSDERMAL | Status: DC
  Filled 2014-11-11: qty 1

## 2014-11-11 MED ORDER — PRENATAL MULTIVITAMIN CH
1.0000 | ORAL_TABLET | Freq: Every day | ORAL | Status: DC
  Administered 2014-11-12 – 2014-11-14 (×3): 1 via ORAL
  Filled 2014-11-11 (×2): qty 1

## 2014-11-11 MED ORDER — NALOXONE HCL 1 MG/ML IJ SOLN
1.0000 ug/kg/h | INTRAVENOUS | Status: DC | PRN
  Filled 2014-11-11: qty 2

## 2014-11-11 MED ORDER — BUPIVACAINE IN DEXTROSE 0.75-8.25 % IT SOLN
INTRATHECAL | Status: DC | PRN
  Administered 2014-11-11: 10 mg via INTRATHECAL

## 2014-11-11 MED ORDER — SCOPOLAMINE 1 MG/3DAYS TD PT72
1.0000 | MEDICATED_PATCH | Freq: Once | TRANSDERMAL | Status: DC
  Administered 2014-11-11: 1.5 mg via TRANSDERMAL

## 2014-11-11 MED ORDER — ACETAMINOPHEN 160 MG/5ML PO SOLN: 325.0000 mg | ORAL | Status: DC | PRN

## 2014-11-11 MED ORDER — SIMETHICONE 80 MG PO CHEW
80.0000 mg | CHEWABLE_TABLET | Freq: Three times a day (TID) | ORAL | Status: DC
  Administered 2014-11-12 – 2014-11-14 (×8): 80 mg via ORAL
  Filled 2014-11-11 (×7): qty 1

## 2014-11-11 MED ORDER — FENTANYL CITRATE 0.05 MG/ML IJ SOLN
INTRAMUSCULAR | Status: AC
  Filled 2014-11-11: qty 2

## 2014-11-11 MED ORDER — WITCH HAZEL-GLYCERIN EX PADS: 1.0000 "application " | MEDICATED_PAD | CUTANEOUS | Status: DC | PRN

## 2014-11-11 SURGICAL SUPPLY — 35 items
BENZOIN TINCTURE PRP APPL 2/3 (GAUZE/BANDAGES/DRESSINGS) ×3 IMPLANT
CATH ROBINSON RED A/P 16FR (CATHETERS) IMPLANT
CLAMP CORD UMBIL (MISCELLANEOUS) IMPLANT
CLIP FILSHIE TUBAL LIGA STRL (Clip) ×6 IMPLANT
CLOSURE WOUND 1/2 X4 (GAUZE/BANDAGES/DRESSINGS) ×1
CLOTH BEACON ORANGE TIMEOUT ST (SAFETY) ×3 IMPLANT
DRAPE SHEET LG 3/4 BI-LAMINATE (DRAPES) IMPLANT
DRSG OPSITE POSTOP 4X10 (GAUZE/BANDAGES/DRESSINGS) ×3 IMPLANT
DURAPREP 26ML APPLICATOR (WOUND CARE) ×3 IMPLANT
ELECT REM PT RETURN 9FT ADLT (ELECTROSURGICAL) ×3
ELECTRODE REM PT RTRN 9FT ADLT (ELECTROSURGICAL) ×1 IMPLANT
EXTRACTOR VACUUM M CUP 4 TUBE (SUCTIONS) IMPLANT
EXTRACTOR VACUUM M CUP 4' TUBE (SUCTIONS)
GLOVE BIOGEL PI IND STRL 7.5 (GLOVE) ×1 IMPLANT
GLOVE BIOGEL PI INDICATOR 7.5 (GLOVE) ×2
GLOVE ECLIPSE 7.5 STRL STRAW (GLOVE) ×3 IMPLANT
GOWN STRL REUS W/TWL LRG LVL3 (GOWN DISPOSABLE) ×9 IMPLANT
KIT ABG SYR 3ML LUER SLIP (SYRINGE) IMPLANT
NEEDLE HYPO 22GX1.5 SAFETY (NEEDLE) ×3 IMPLANT
NEEDLE HYPO 25X5/8 SAFETYGLIDE (NEEDLE) IMPLANT
NS IRRIG 1000ML POUR BTL (IV SOLUTION) ×3 IMPLANT
PACK C SECTION WH (CUSTOM PROCEDURE TRAY) ×3 IMPLANT
PAD OB MATERNITY 4.3X12.25 (PERSONAL CARE ITEMS) ×3 IMPLANT
RTRCTR C-SECT PINK 25CM LRG (MISCELLANEOUS) ×3 IMPLANT
STRIP CLOSURE SKIN 1/2X4 (GAUZE/BANDAGES/DRESSINGS) ×2 IMPLANT
SUT MNCRL 0 VIOLET CTX 36 (SUTURE) IMPLANT
SUT MONOCRYL 0 CTX 36 (SUTURE)
SUT VIC AB 0 CTX 36 (SUTURE) ×6
SUT VIC AB 0 CTX36XBRD ANBCTRL (SUTURE) ×3 IMPLANT
SUT VIC AB 2-0 CT1 27 (SUTURE) ×2
SUT VIC AB 2-0 CT1 TAPERPNT 27 (SUTURE) ×1 IMPLANT
SUT VIC AB 4-0 KS 27 (SUTURE) ×3 IMPLANT
SYR 30ML LL (SYRINGE) ×3 IMPLANT
TOWEL OR 17X24 6PK STRL BLUE (TOWEL DISPOSABLE) ×3 IMPLANT
TRAY FOLEY CATH 14FR (SET/KITS/TRAYS/PACK) ×3 IMPLANT

## 2014-11-11 NOTE — Anesthesia Postprocedure Evaluation (Signed)
  Anesthesia Post Note  Patient: Elizabeth West  Procedure(s) Performed: Procedure(s) (LRB): CESAREAN SECTION WITH BILATERAL TUBAL LIGATION (N/A)  Anesthesia type: Spinal  Patient location: PACU  Post pain: Pain level controlled  Post assessment: Post-op Vital signs reviewed  Last Vitals:  Filed Vitals:   11/11/14 1145  BP: 83/49  Pulse: 63  Temp:   Resp: 15    Post vital signs: Reviewed  Level of consciousness: awake  Complications: No apparent anesthesia complications

## 2014-11-11 NOTE — Anesthesia Procedure Notes (Signed)
Spinal Patient location during procedure: OR Start time: 11/11/2014 9:35 AM Staffing Anesthesiologist: Brayton CavesJACKSON, Elizabeth West Performed by: anesthesiologist  Preanesthetic Checklist Completed: patient identified, site marked, surgical consent, pre-op evaluation, timeout performed, IV checked, risks and benefits discussed and monitors and equipment checked Spinal Block Patient position: sitting Prep: DuraPrep Patient monitoring: heart rate, cardiac monitor, continuous pulse ox and blood pressure Approach: midline Location: L3-4 Injection technique: single-shot Needle Needle type: Sprotte  Needle gauge: 24 G Needle length: 9 cm Assessment Sensory level: T4 Additional Notes Patient identified.  Risk benefits discussed including failed block, incomplete pain control, headache, nerve damage, paralysis, blood pressure changes, nausea, vomiting, reactions to medication both toxic or allergic, and postpartum back pain.  Patient expressed understanding and wished to proceed.  All questions were answered.  Sterile technique used throughout procedure.  CSF was clear.  No parasthesia or other complications.  Please see nursing notes for vital signs.

## 2014-11-11 NOTE — Progress Notes (Signed)
Ur chart review completed.  

## 2014-11-11 NOTE — Lactation Note (Signed)
This note was copied from the chart of Elizabeth Ireene Fallert. Lactation Consultation Note Initial visit at 8hours of age.  Mom reports baby has had some bottles of formula due to low blood sugar.  Mom reports breast feeding her older son for 2 1 /2 years without problems but mentions she did supplement the whole time.  Encouraged mom to establish good milk supply before considering additional supplementation if low blood sugars resolve.  Mom has small breast with little growth during pregnancy she reports.  Breasts are somewhat wide spaced as well.  Right nipple is somewhat flat with middle of nipple inverted or diveted.  Left nipple is flat.  Mom has easily expressed colostrum.  Attempted latching baby in football hold he is sleepy and not attempting.  Spoon fed 4mls of colostrum and baby became vigorous to eat.  Hand pump used to evert nipples and re-attempt latch. Baby then latches for a good 7 minutes feeding.  Latch required assistance and involved FOB who reports he thinks he can help with next feeding.  Encouraged mom to hand express and spoon feed her colostrum to increase her supply of milk and feed the baby.  [facility] resources given and discussed.  Encouraged to feed with early cues on demand.  Early newborn behavior discussed.  Mom to call for assist as needed.     Patient Name: Elizabeth West AOZHY'QToday's Date: 11/11/2014 Reason for consult: Initial assessment   Maternal Data Has patient been taught Hand Expression?: Yes Does the patient have breastfeeding experience prior to this delivery?: Yes  Feeding Feeding Type: Breast Fed Length of feed: 7 min  LATCH Score/Interventions Latch: Grasps breast easily, tongue down, lips flanged, rhythmical sucking.  Audible Swallowing: A few with stimulation  Type of Nipple: Flat Intervention(s): Hand pump  Comfort (Breast/Nipple): Soft / non-tender     Hold (Positioning): Assistance needed to correctly position infant at breast and  maintain latch.  LATCH Score: 7  Lactation Tools Discussed/Used Initiated by:: JS Date initiated:: 11/11/14   Consult Status Consult Status: Follow-up Date: 11/12/14 Follow-up type: In-patient    Elizabeth West, Elizabeth West 11/11/2014, 6:20 PM

## 2014-11-11 NOTE — H&P (Signed)
LABOR ADMISSION HISTORY AND PHYSICAL  Jackie Metta ClinesSinchury is a 34 y.o. female G2P1001 with IUP at 8659w0d by LMP  presenting for repeat cesarean section.  She plans on breast feeding. She request BTL for birth control.  Dating: By LMP cw 5955w4d sono --->  Estimated Date of Delivery: 11/18/14   Prenatal History/Complications:  Past Medical History: Past Medical History  Diagnosis Date  . Diabetes mellitus without complication     gestational diabetes    Past Surgical History: Past Surgical History  Procedure Laterality Date  . Cholecystectomy  2012    Obstetrical History: OB History    Gravida Para Term Preterm AB TAB SAB Ectopic Multiple Living   2 1 1       1       Social History: History   Social History  . Marital Status: Married    Spouse Name: N/A    Number of Children: N/A  . Years of Education: N/A   Social History Main Topics  . Smoking status: Never Smoker   . Smokeless tobacco: None  . Alcohol Use: No  . Drug Use: No  . Sexual Activity: Yes    Birth Control/ Protection: Injection   Other Topics Concern  . None   Social History Narrative    Family History: Family History  Problem Relation Age of Onset  . Alcohol abuse Neg Hx   . Asthma Neg Hx   . Arthritis Neg Hx   . Birth defects Neg Hx   . Cancer Neg Hx   . COPD Neg Hx   . Depression Neg Hx   . Diabetes Neg Hx   . Drug abuse Neg Hx   . Early death Neg Hx   . Hearing loss Neg Hx   . Heart disease Neg Hx   . Hyperlipidemia Neg Hx   . Hypertension Neg Hx   . Kidney disease Neg Hx   . Learning disabilities Neg Hx   . Mental illness Neg Hx   . Mental retardation Neg Hx   . Miscarriages / Stillbirths Neg Hx   . Stroke Neg Hx   . Vision loss Neg Hx   . Varicose Veins Neg Hx     Allergies: No Known Allergies  Prescriptions prior to admission  Medication Sig Dispense Refill Last Dose  . aspirin 81 MG chewable tablet Chew 1 tablet (81 mg total) by mouth daily. 90 tablet 3 11/10/2014 at  Unknown time  . docusate sodium (COLACE) 100 MG capsule Take 1 capsule (100 mg total) by mouth 2 (two) times daily. 20 capsule 1 Past Week at Unknown time  . glyBURIDE (DIABETA) 2.5 MG tablet Take 3.75 mg by mouth every evening.   11/10/2014 at 2000  . glyBURIDE (DIABETA) 2.5 MG tablet Take 5 mg by mouth daily with breakfast.   11/10/2014 at Unknown time  . prenatal vitamin w/FE, FA (PRENATAL 1 + 1) 27-1 MG TABS tablet Take 1 tablet by mouth daily at 12 noon. 30 each 11 11/10/2014 at Unknown time     Review of Systems   All systems reviewed and negative except as stated in HPI  Blood pressure 101/75, pulse 85, temperature 97.5 F (36.4 C), temperature source Oral, resp. rate 18, last menstrual period 02/11/2014, SpO2 99 %. General appearance: alert and cooperative Lungs: clear to auscultation bilaterally Heart: regular rate and rhythm Abdomen: soft, non-tender; bowel sounds normal Extremities: Homans sign is negative, no sign of DVT     Prenatal labs: ABO, Rh: --/--/O POS (  01/04 0920) Antibody: NEG (01/04 0916) Rubella:   RPR: NON REAC (01/04 0916)  HBsAg: Negative (07/06 0000)  HIV: NONREACTIVE (11/02 1327)  GBS: Negative (12/21 0000)  3 hr Glucola 117/259/265/215 Genetic screening  Thickened nuchal, NIPS normal Anatomy US normal with increased nuchal fold     VBAC consent signed 10/13/14 Clinic HR  Dating  LMP + 13 wk [place]  Genetic Screen Thickened nuchal; NIPS drawn:  Nml female per pt   Anatomic [place] Normal except for increased nuchal fold  GTT Early: very high at health dept.  TDaP vaccine  09/07/14  Flu vaccine  09/07/14  GBS neg  Contraception BTL - consent signed 10/15/14  Baby Food Breast  Circumcision No  Pediatrician [facility]  Support Person Husband, mother-in-law      Results for orders placed or performed during the hospital encounter of 11/11/14 (from the past 24 hour(s))  Platelet count   Collection Time: 11/11/14  8:00 AM  Result Value Ref Range    Platelets 117 (L) 150 - 400 K/uL  Glucose, capillary   Collection Time: 11/11/14  8:06 AM  Result Value Ref Range   Glucose-Capillary 70 70 - 99 mg/dL    Patient Active Problem List   Diagnosis Date Noted  . [redacted] weeks gestation of pregnancy   . Preexisting diabetes complicating pregnancy, antepartum   . Increased nuchal translucency space on fetal ultrasound 06/01/2014  . Supervision of high risk pregnancy in second trimester 05/25/2014  . Gestational diabetes mellitus, antepartum 05/13/2014  . Pregnancy with history of caesarean section, antepartum 05/13/2014    Assessment: Oleda Borski is a 34 y.o. G2P1001 at [redacted]w[redacted]d with A2/B diabetes on glyburide here for repeat cesarean section  #ID:  negative #MOF: breast #MOC:BTS #Circ:  Declines  The risks of cesarean section discussed with the patient included but were not limited to: bleeding which may require transfusion or reoperation; infection which may require antibiotics; injury to bowel, bladder, ureters or other surrounding organs; injury to the fetus; need for additional procedures including hysterectomy in the event of a life-threatening hemorrhage; placental abnormalities wth subsequent pregnancies, incisional problems, thromboembolic phenomenon and other postoperative/anesthesia complications. The patient concurred with the proposed plan, giving informed written consent for the procedure.   She will remain NPO for procedure. Anesthesia and OR aware. Preoperative prophylactic antibiotics and SCDs ordered on call to the OR.  To OR when ready.   Kaiah Hosea ROCIO 11/11/2014, 9:01 AM

## 2014-11-11 NOTE — Op Note (Signed)
Elizabeth West PROCEDURE DATE: 11/11/2014  PREOPERATIVE DIAGNOSIS: Intrauterine pregnancy at  5071w0d weeks gestation; previous cesarean section, undesired fertility  POSTOPERATIVE DIAGNOSIS: The same  PROCEDURE: Repeat Low Transverse Cesarean Section with bilateral tubal ligation  SURGEON:  Dr. Candelaria CelesteJacob Stinson  ASSISTANT: Dr Fredirick LatheKristy Acosta  INDICATIONS: Elizabeth West is a 34 y.o. G2P1001 at 3371w0d scheduled for cesarean section secondary to previous cesarean section.  The risks of cesarean section discussed with the patient included but were not limited to: bleeding which may require transfusion or reoperation; infection which may require antibiotics; injury to bowel, bladder, ureters or other surrounding organs; injury to the fetus; need for additional procedures including hysterectomy in the event of a life-threatening hemorrhage; placental abnormalities wth subsequent pregnancies, incisional problems, thromboembolic phenomenon and other postoperative/anesthesia complications. The patient concurred with the proposed plan, giving informed written consent for the procedure.    FINDINGS:  Viable female infant in cephalic presentation.  Apgars 9 and 9, weight, 9 pounds and 11 ounces.  Clear amniotic fluid.  Intact placenta, three vessel cord.  Normal uterus, fallopian tubes and ovaries bilaterally.  ANESTHESIA:    Spinal INTRAVENOUS FLUIDS:2700 ml ESTIMATED BLOOD LOSS: 700 ml URINE OUTPUT:  200 ml SPECIMENS: Placenta sent to L&D COMPLICATIONS: None immediate  PROCEDURE IN DETAIL:  The patient received intravenous antibiotics and had sequential compression devices applied to her lower extremities while in the preoperative area.  She was then taken to the operating room where spinal anesthesia was administered and was found to be adequate. She was then placed in a dorsal supine position with a leftward tilt, and prepped and draped in a sterile manner.  A foley catheter was placed into her bladder  and attached to constant gravity, which drained clear fluid throughout.  After an adequate timeout was performed, a Pfannenstiel skin incision was made with scalpel and carried through to the underlying layer of fascia. The fascia was incised in the midline and this incision was extended bilaterally using the Mayo scissors. Kocher clamps were applied to the superior aspect of the fascial incision and the underlying rectus muscles were dissected off bluntly. A similar process was carried out on the inferior aspect of the facial incision. The rectus muscles were separated in the midline bluntly and the peritoneum was entered bluntly. An Alexis retractor was placed to aid in visualization of the uterus.  Attention was turned to the lower uterine segment where a transverse hysterotomy was made with a scalpel and extended bilaterally bluntly. The infant was successfully delivered, and cord was clamped and cut and infant was handed over to awaiting neonatology team. Uterine massage was then administered and the placenta delivered intact with three-vessel cord. The uterus was then cleared of clot and debris.  The hysterotomy was closed with 0 Vicryl in a running locked fashion, and an imbricating layer was also placed with a 0 Monocryl. Overall, excellent hemostasis was noted. The abdomen and the pelvis were cleared of all clot and debris and the Jon Gillslexis was removed. Hemostasis was confirmed on all surfaces.  The rectus muscles and peritoneum was reapproximated using 2-0 vicryl an interrupted stitch. The fascia was then closed using 0 Vicryl in a running fashion.  The subcutaneous layer was reapproximated with plain gut and the skin was closed with 4-0 vicryl. The patient tolerated the procedure well. Sponge, lap, instrument and needle counts were correct x 2. She was taken to the recovery room in stable condition.    Candelaria CelesteSTINSON, JACOB JEHIEL DO 11/11/2014 11:07 AM

## 2014-11-11 NOTE — Anesthesia Preprocedure Evaluation (Signed)
Anesthesia Evaluation  Patient identified by MRN, date of birth, ID band Patient awake    Reviewed: Allergy & Precautions, H&P , NPO status , Patient's Chart, lab work & pertinent test results  Airway Mallampati: II       Dental   Pulmonary  breath sounds clear to auscultation        Cardiovascular Exercise Tolerance: Good Rhythm:regular Rate:Normal     Neuro/Psych    GI/Hepatic   Endo/Other  diabetes  Renal/GU      Musculoskeletal   Abdominal   Peds  Hematology   Anesthesia Other Findings thrombocytopenia  Reproductive/Obstetrics (+) Pregnancy                             Anesthesia Physical Anesthesia Plan  ASA: III  Anesthesia Plan: Spinal   Post-op Pain Management:    Induction:   Airway Management Planned:   Additional Equipment:   Intra-op Plan:   Post-operative Plan:   Informed Consent: I have reviewed the patients History and Physical, chart, labs and discussed the procedure including the risks, benefits and alternatives for the proposed anesthesia with the patient or authorized representative who has indicated his/her understanding and acceptance.     Plan Discussed with: Anesthesiologist, CRNA and Surgeon  Anesthesia Plan Comments:         Anesthesia Quick Evaluation

## 2014-11-11 NOTE — Transfer of Care (Signed)
Immediate Anesthesia Transfer of Care Note  Patient: Elizabeth West  Procedure(s) Performed: Procedure(s) with comments: CESAREAN SECTION WITH BILATERAL TUBAL LIGATION (N/A) - REPEAT  Patient Location: PACU  Anesthesia Type:Spinal  Level of Consciousness: awake, alert  and oriented  Airway & Oxygen Therapy: Patient Spontanous Breathing  Post-op Assessment: Report given to PACU RN  Post vital signs: Reviewed  Complications: No apparent anesthesia complications

## 2014-11-11 NOTE — Addendum Note (Signed)
Addendum  created 11/11/14 1636 by Elbert Ewingsolleen S Suhaas Agena, CRNA   Modules edited: Notes Section   Notes Section:  File: [id number]

## 2014-11-11 NOTE — Anesthesia Postprocedure Evaluation (Signed)
  Anesthesia Post-op Note  Patient: Elizabeth West  Procedure(s) Performed: Procedure(s) with comments: CESAREAN SECTION WITH BILATERAL TUBAL LIGATION (N/A) - REPEAT  Patient Location: PACU and Mother/Baby  Anesthesia Type:Spinal  Level of Consciousness: awake, alert  and oriented  Airway and Oxygen Therapy: Patient Spontanous Breathing  Post-op Pain: mild  Post-op Assessment: Post-op Vital signs reviewed, Patient's Cardiovascular Status Stable, Respiratory Function Stable, No signs of Nausea or vomiting, Adequate PO intake, Pain level controlled and No headache  Post-op Vital Signs: Reviewed and stable  Last Vitals:  Filed Vitals:   11/11/14 1430  BP: 105/57  Pulse: 61  Temp: 36.9 C  Resp: 20    Complications: No apparent anesthesia complications

## 2014-11-12 ENCOUNTER — Encounter (HOSPITAL_COMMUNITY): Payer: Self-pay | Admitting: Family Medicine

## 2014-11-12 LAB — CBC
HEMATOCRIT: 33.5 % — AB (ref 36.0–46.0)
Hemoglobin: 10.7 g/dL — ABNORMAL LOW (ref 12.0–15.0)
MCH: 25.1 pg — ABNORMAL LOW (ref 26.0–34.0)
MCHC: 31.9 g/dL (ref 30.0–36.0)
MCV: 78.5 fL (ref 78.0–100.0)
Platelets: 102 10*3/uL — ABNORMAL LOW (ref 150–400)
RBC: 4.27 MIL/uL (ref 3.87–5.11)
RDW: 15.4 % (ref 11.5–15.5)
WBC: 11 10*3/uL — ABNORMAL HIGH (ref 4.0–10.5)

## 2014-11-12 LAB — BIRTH TISSUE RECOVERY COLLECTION (PLACENTA DONATION)

## 2014-11-12 NOTE — Lactation Note (Signed)
This note was copied from the chart of Elizabeth Venida Herbig. Lactation Consultation Note  Mother's right nipple inverted but has pulled out with breastfeeding and is very tender. Mother was able to express good flow of colostrum.   Baby latched for a few min on the right side but mother stated it was too tender. Repositioned baby in football hold on left and baby latched but mother stated it hurt too much. Switched to cross cradle and baby latched off and on for 15-20 min.  Sucks and some swallows observed. Offered DEBP to mother but parents at this time do not want to pump but wanted to bottle feed after breastfeeding instead. Demonstrated to FOB how to do chin tug for wider latch and help mother position baby. Discussed supply and demand.  Encouraged mother to breastfeed often and to pump right breast w/ hand pump if baby is not latching.   Patient Name: Elizabeth Phillips ClimesSaraswati Venuti AVWUJ'WToday's Date: 11/12/2014 Reason for consult: Follow-up assessment   Maternal Data    Feeding Feeding Type: Breast Fed Length of feed: 25 min (off and on)  LATCH Score/Interventions Latch: Repeated attempts needed to sustain latch, nipple held in mouth throughout feeding, stimulation needed to elicit sucking reflex.  Audible Swallowing: Spontaneous and intermittent  Type of Nipple: Flat Intervention(s): Hand pump  Comfort (Breast/Nipple): Filling, red/small blisters or bruises, mild/mod discomfort  Problem noted: Mild/Moderate discomfort Interventions (Mild/moderate discomfort): Pre-pump if needed  Hold (Positioning): Assistance needed to correctly position infant at breast and maintain latch.  LATCH Score: 6  Lactation Tools Discussed/Used     Consult Status Consult Status: Follow-up Date: 11/13/14 Follow-up type: In-patient    Dahlia ByesBerkelhammer, Elizabeth West [facility] 11/12/2014, 2:32 PM

## 2014-11-12 NOTE — Progress Notes (Signed)
Subjective: Postpartum Day #1: Cesarean Delivery & BTL Patient reports tolerating PO and + flatus.   Breast & Bottle feeding   Objective: Vital signs in last 24 hours: Temp:  [97.5 F (36.4 C)-98.6 F (37 C)] 97.5 F (36.4 C) (01/07 0514) Pulse Rate:  [48-80] 58 (01/07 0514) Resp:  [15-26] 20 (01/07 0514) BP: (83-116)/(49-63) 113/57 mmHg (01/07 0514) SpO2:  [94 %-100 %] 99 % (01/07 0514)  Physical Exam:  General: alert and cooperative Lochia: appropriate Uterine Fundus: firm Incision: no significant drainage DVT Evaluation: No evidence of DVT seen on physical exam. Negative Homan's sign. No cords or calf tenderness. No significant calf/ankle edema.   Recent Labs  11/09/14 0916 11/12/14 0755  HGB 12.2 10.7*  HCT 37.5 33.5*    Assessment/Plan: Status post Cesarean section. Doing well postoperatively.  Continue current care.  Zarian Colpitts 11/12/2014, 9:05 AM

## 2014-11-13 DIAGNOSIS — Z3490 Encounter for supervision of normal pregnancy, unspecified, unspecified trimester: Secondary | ICD-10-CM

## 2014-11-13 MED ORDER — OXYCODONE-ACETAMINOPHEN 5-325 MG PO TABS: 1.0000 | ORAL_TABLET | ORAL | Status: DC | PRN

## 2014-11-13 NOTE — Discharge Summary (Signed)
  Obstetric Discharge Summary Reason for Admission: cesarean section Prenatal Procedures: NST and ultrasound Intrapartum Procedures: cesarean: low cervical, transverse and tubal ligation Postpartum Procedures: none Complications-Operative and Postpartum: none   Hospital Course:  Active Problems:   * No active hospital problems. *   Elizabeth West is a 34 y.o. G2P2001 s/p RLTCS.  Patient presented with DM type 2.  She has postpartum course that was uncomplicated including no problems with ambulating, PO intake, urination, pain, or bleeding. The pt feels ready to go home and  will be discharged with outpatient follow-up.   Today: No acute events overnight.  Pt denies problems with ambulating, voiding or po intake.  She denies nausea or vomiting.  Pain is moderately controlled.  She has had flatus. She has not had bowel movement.  Lochia Small.  Plan for birth control is  bilateral tubal ligation.  Method of Feeding: breast and bottle   H/H: Lab Results  Component Value Date/Time   HGB 10.7* 11/12/2014 07:55 AM   HGB 12.3 05/07/2014   HGB 12.3 05/07/2014   HGB 12.3 05/07/2014   HCT 33.5* 11/12/2014 07:55 AM   HCT 38 05/07/2014    Discharge Diagnoses: Term Pregnancy-delivered  Discharge Information: Date: 11/13/2014 Activity: pelvic rest Diet: routine  Medications: PNV and Percocet Breast feeding:  Yes Condition: stable Instructions: refer to handout Discharge to: home      Medication List    TAKE these medications        aspirin 81 MG chewable tablet  Chew 1 tablet (81 mg total) by mouth daily.     docusate sodium 100 MG capsule  Commonly known as:  COLACE  Take 1 capsule (100 mg total) by mouth 2 (two) times daily.     glyBURIDE 2.5 MG tablet  Commonly known as:  DIABETA  Take 3.75 mg by mouth every evening.     glyBURIDE 2.5 MG tablet  Commonly known as:  DIABETA  Take 5 mg by mouth daily with breakfast.     oxyCODONE-acetaminophen 5-325 MG per  tablet  Commonly known as:  PERCOCET/ROXICET  Take 1 tablet by mouth every 4 (four) hours as needed for moderate pain or severe pain.     prenatal vitamin w/FE, FA 27-1 MG Tabs tablet  Take 1 tablet by mouth daily at 12 noon.       Follow-up Information    [facility] Call in 1 week.   Specialty:  Obstetrics and Gynecology   Contact information:   [address] [place] [id number] [phone]      Kathee DeltonIan D Miloh Alcocer, MD,MS,  PGY1 11/13/2014 3:09 PM

## 2014-11-13 NOTE — Lactation Note (Signed)
This note was copied from the chart of Elizabeth Ivon Grandison. Lactation Consultation Note  Parents state latch has improved.  Baby has been mostly formula fed. Parents were worried if she had enough breastmilk,  Discussed supply and demand. Mother's nipples are tender.  Provided mother w/ comfort gels and discussed use. Mom encouraged to feed baby 8-12 times/24 hours and with feeding cues.  Suggest she call if she has trouble latching baby.   Patient Name: Elizabeth West WUJWJ'XToday's Date: 11/13/2014 Reason for consult: Follow-up assessment   Maternal Data Has patient been taught Hand Expression?: Yes  Feeding    LATCH Score/Interventions                      Lactation Tools Discussed/Used     Consult Status Consult Status: Follow-up Date: 11/14/14 Follow-up type: In-patient    Dahlia ByesBerkelhammer, [facility] 11/13/2014, 10:51 AM

## 2014-11-13 NOTE — Discharge Instructions (Signed)

## 2014-11-14 NOTE — Discharge Summary (Signed)
Obstetric Discharge Summary Reason for Admission: cesarean section- scheduled repeat Prenatal Procedures: none Intrapartum Procedures: cesarean: low cervical, transverse and tubal ligation Postpartum Procedures: none Complications-Operative and Postpartum: none HEMOGLOBIN  Date Value Ref Range Status  11/12/2014 10.7* 12.0 - 15.0 g/dL Final  40/98/119107/12/2013 47.812.3 g/dL Final  29/56/213007/12/2013 86.512.3 g/dL Final  78/46/962907/12/2013 52.812.3 g/dL Final   HCT  Date Value Ref Range Status  11/12/2014 33.5* 36.0 - 46.0 % Final  05/07/2014 38 % Final   Elizabeth West is a 34 y.o. G2P2001 s/p RLTCS. Patient presented with DM type 2. She has postpartum course that was uncomplicated including no problems with ambulating, PO intake, urination, pain, or bleeding. The pt feels ready to go home and will be discharged with outpatient follow-up. She is breast and bottlefeeding.   Physical Exam:  General: alert, cooperative and no distress  Heart: RRR Lungs: nl effort Lochia: appropriate Uterine Fundus: firm Incision: honeycomb dsg intact, marked and unchanged DVT Evaluation: No evidence of DVT seen on physical exam.  Discharge Diagnoses: Term Pregnancy-delivered & BTL w/ Filshie clips  Discharge Information: Date: 11/14/2014 Activity: pelvic rest Diet: routine Medications: PNV, Ibuprofen and Percocet Condition: stable Instructions: refer to practice specific booklet Discharge to: home Follow-up Information    [facility] Schedule an appointment as soon as possible for a visit in 6 weeks.   Specialty:  Obstetrics and Gynecology   Why:  For your postpartum appointment.   Contact information:   [address] [place] [phone]      Newborn Data: Live born female  Birth Weight: 9 lb 10.9 oz (4390 g) APGAR: 9, 9  Home with mother.  Cam HaiSHAW, KIMBERLY CNM 11/14/2014, 9:47 AM

## 2014-11-14 NOTE — Lactation Note (Signed)
This note was copied from the chart of Elizabeth Shresta Leonhart. Lactation Consultation Note  Patient Name: Elizabeth West [id number]'IToday's Date: 11/14/2014 Reason for consult: Follow-up assessment  Baby is 673 hours old and per mom has been breast feeding and bottle feeding . LC updated  The doc flow sheets with mom and dads information.  Mom denies sore nipples. LC reviewed sore nipple and engorgement prevention and tx.Mom already had been given  A hand pump and per mom feels comfortable using it. LC reviewed information from Baby and me booklet , stressing the importance of depth at the breast  Breast( showing pictures ) ,Pages 24 and 25.    Maternal Data    Feeding Feeding Type: Bottle Fed - Formula Nipple Type: Slow - flow  LATCH Score/Interventions                Intervention(s): Breastfeeding basics reviewed     Lactation Tools Discussed/Used Pump Review: Milk Storage;Setup, frequency, and cleaning   Consult Status Consult Status: Complete Date: 11/14/14    Elizabeth West, Elizabeth West 11/14/2014, 11:39 AM

## 2014-11-19 ENCOUNTER — Telehealth: Payer: Self-pay

## 2014-11-19 NOTE — Telephone Encounter (Signed)
[facility] stating she saw patient yesterday late afternoon for PP visit and patient c/o dysuria-- unsure if she has UTI. Suggested we call patient. Called patient who confirms pain/burning with urination. Advised she come in to give urine specimen. Patient states she can come in tomorrow at 0900. Informed patient she will be added to schedule. Message sent to admin pool.

## 2014-11-20 ENCOUNTER — Ambulatory Visit (INDEPENDENT_AMBULATORY_CARE_PROVIDER_SITE_OTHER): Payer: BLUE CROSS/BLUE SHIELD | Admitting: *Deleted

## 2014-11-20 DIAGNOSIS — R300 Dysuria: Secondary | ICD-10-CM | POA: Diagnosis not present

## 2014-11-20 LAB — POCT URINALYSIS DIP (DEVICE)
Bilirubin Urine: NEGATIVE
GLUCOSE, UA: NEGATIVE mg/dL
Ketones, ur: NEGATIVE mg/dL
Nitrite: NEGATIVE
PROTEIN: NEGATIVE mg/dL
Urobilinogen, UA: 0.2 mg/dL (ref 0.0–1.0)
pH: 5 (ref 5.0–8.0)

## 2014-11-20 MED ORDER — NITROFURANTOIN MONOHYD MACRO 100 MG PO CAPS: 100.0000 mg | ORAL_CAPSULE | Freq: Two times a day (BID) | ORAL | Status: DC

## 2014-11-20 MED ORDER — PHENAZOPYRIDINE HCL 200 MG PO TABS: 200.0000 mg | ORAL_TABLET | Freq: Three times a day (TID) | ORAL | Status: DC | PRN

## 2014-11-20 NOTE — Progress Notes (Signed)
Here for uti - c/o burning with urination.

## 2014-12-08 ENCOUNTER — Encounter: Payer: Self-pay | Admitting: Obstetrics & Gynecology

## 2014-12-23 ENCOUNTER — Encounter: Payer: Self-pay | Admitting: Obstetrics & Gynecology

## 2014-12-23 ENCOUNTER — Ambulatory Visit (INDEPENDENT_AMBULATORY_CARE_PROVIDER_SITE_OTHER): Payer: Medicaid Other | Admitting: Obstetrics & Gynecology

## 2014-12-23 DIAGNOSIS — Z8632 Personal history of gestational diabetes: Secondary | ICD-10-CM | POA: Insufficient documentation

## 2014-12-23 NOTE — Progress Notes (Signed)
    Subjective:     Elizabeth West is a 34 y.o. 272P2002 female who presents for a postpartum visit. She is 6 weeks postpartum following a RLTCS and BTL. I have fully reviewed the prenatal and intrapartum course; she had A2/B GDM and was on Glyburide. The delivery was at 39 gestational weeks. Postpartum course has been uncomplicated. Baby's course has been uncomplicated. Baby is feeding by breast. Bleeding no bleeding. Bowel function is normal. Bladder function is normal. Patient is not sexually active. Contraception method is tubal ligation. Postpartum depression screening: negative.  The following portions of the patient's history were reviewed and updated as appropriate: allergies, current medications, past family history, past medical history, past social history, past surgical history and problem list.  Last pap smear on 06/10/2011 normal.  Review of Systems Pertinent items are noted in HPI.   Objective:    BP 96/69 mmHg  Pulse 84  Temp(Src) 97.9 F (36.6 C) (Oral)  Resp 20  Ht 4\' 11"  (1.499 m)  Wt 156 lb 1.6 oz (70.806 kg)  BMI 31.51 kg/m2  Breastfeeding? Yes  General:  alert and no distress   Breasts:  inspection negative, no nipple discharge or bleeding, no masses or nodularity palpable  Lungs: clear to auscultation bilaterally  Heart:  regular rate and rhythm  Abdomen: soft, non-tender; bowel sounds normal; no masses,  no organomegaly and incision is well-healed, no erythema, no induration   Pelvis:  not evaluated        Assessment:   Normal postpartum exam. Pap smear not done at today's visit.   Plan:   1. Contraception: tubal ligation 2. Follow up soon for 2 hr postpartum GTT given history of GDM 3. Routine preventative health maintenance measures emphasized.    Jaynie CollinsUGONNA  Kadynce Bonds, MD, FACOG Attending Obstetrician & Gynecologist Faculty Practice, [facility] [place]

## 2014-12-23 NOTE — Patient Instructions (Signed)
Glucose Tolerance Test This is a test to see how your body processes carbohydrates. This test is often done to check patients for diabetes or the possibility of developing it. PREPARATION FOR TEST You should have nothing to eat or drink 12 hours before the test. You will be given a form of sugar (glucose) and then blood samples will be drawn from your vein to determine the level of sugar in your blood. Alternatively, blood may be drawn from your finger for testing. You should not smoke or exercise during the test. NORMAL FINDINGS  Fasting: 70-115 mg/dL  2 hours: less than 782140 mg/dL Ranges for normal findings may vary among different laboratories and hospitals. You should always check with your doctor after having lab work or other tests done to discuss the meaning of your test results and whether your values are considered within normal limits. MEANING OF TEST Your caregiver will go over the test results with you and discuss the importance and meaning of your results, as well as treatment options and the need for additional tests. OBTAINING THE TEST RESULTS It is your responsibility to obtain your test results. Ask the lab or department performing the test when and how you will get your results. Document Released: 11/15/2004 Document Revised: 01/15/2012 Document Reviewed: 02/27/2014 [facility] Patient Information 2015 [place]. This information is not intended to replace advice given to you by your health care provider. Make sure you discuss any questions you have with your health care provider.

## 2014-12-28 ENCOUNTER — Other Ambulatory Visit: Payer: Medicaid Other

## 2014-12-28 DIAGNOSIS — Z8632 Personal history of gestational diabetes: Secondary | ICD-10-CM

## 2014-12-29 ENCOUNTER — Encounter: Payer: Self-pay | Admitting: Obstetrics & Gynecology

## 2014-12-29 DIAGNOSIS — E119 Type 2 diabetes mellitus without complications: Secondary | ICD-10-CM | POA: Insufficient documentation

## 2014-12-29 LAB — GLUCOSE TOLERANCE, 2 HOURS
GLUCOSE, 2 HOUR: 256 mg/dL — AB (ref 70–139)
Glucose, Fasting: 119 mg/dL — ABNORMAL HIGH (ref 70–99)

## 2014-12-30 ENCOUNTER — Encounter: Payer: Self-pay | Admitting: *Deleted

## 2014-12-30 ENCOUNTER — Telehealth: Payer: Self-pay | Admitting: General Practice

## 2014-12-30 NOTE — Telephone Encounter (Signed)
Called patient and informed her of results and discussed importance of follow up with a provider. Gave patient number to [facility] and address. Told patient she needs to call each day at 930 in the morning until she gets an appt. Patient verbalized understanding and had no questions

## 2014-12-30 NOTE — Telephone Encounter (Signed)
-----   Message from Tereso NewcomerUgonna A Anyanwu, MD sent at 12/29/2014  8:42 AM EST ----- Patient has Type II DM, needs to follow up with PCP. Please call to inform patient of results and recommendations.

## 2015-01-07 ENCOUNTER — Encounter: Payer: Self-pay | Admitting: Obstetrics & Gynecology

## 2015-06-03 ENCOUNTER — Encounter (HOSPITAL_COMMUNITY): Payer: Self-pay | Admitting: Emergency Medicine

## 2015-06-03 ENCOUNTER — Emergency Department (INDEPENDENT_AMBULATORY_CARE_PROVIDER_SITE_OTHER)
Admission: EM | Admit: 2015-06-03 | Discharge: 2015-06-03 | Disposition: A | Discharge status: Home / Self Care | Payer: Self-pay | Source: Home / Self Care | Attending: Family Medicine | Admitting: Family Medicine

## 2015-06-03 DIAGNOSIS — R21 Rash and other nonspecific skin eruption: Secondary | ICD-10-CM

## 2015-06-03 MED ORDER — PREDNISONE 20 MG PO TABS
ORAL_TABLET | ORAL | Status: DC
Start: 2015-06-03 — End: 2015-06-29

## 2015-06-03 NOTE — ED Notes (Signed)
C/o rash all over body onset 1 week... Denies fevers, chills Has been taking zyrtec w/no relief Alert, no signs of acute distress.

## 2015-06-03 NOTE — Discharge Instructions (Signed)
Rash Prednisone taper dose, recommend not breast feeding 4 hours after taking this medicine claritin 10 mg a day  A rash is a change in the color or texture of your skin. There are many different types of rashes. You may have other problems that accompany your rash. CAUSES   Infections.  Allergic reactions. This can include allergies to pets or foods.  Certain medicines.  Exposure to certain chemicals, soaps, or cosmetics.  Heat.  Exposure to poisonous plants.  Tumors, both cancerous and noncancerous. SYMPTOMS   Redness.  Scaly skin.  Itchy skin.  Dry or cracked skin.  Bumps.  Blisters.  Pain. DIAGNOSIS  Your caregiver may do a physical exam to determine what type of rash you have. A skin sample (biopsy) may be taken and examined under a microscope. TREATMENT  Treatment depends on the type of rash you have. Your caregiver may prescribe certain medicines. For serious conditions, you may need to see a skin doctor (dermatologist). HOME CARE INSTRUCTIONS   Avoid the substance that caused your rash.  Do not scratch your rash. This can cause infection.  You may take cool baths to help stop itching.  Only take over-the-counter or prescription medicines as directed by your caregiver.  Keep all follow-up appointments as directed by your caregiver. SEEK IMMEDIATE MEDICAL CARE IF:  You have increasing pain, swelling, or redness.  You have a fever.  You have new or severe symptoms.  You have body aches, diarrhea, or vomiting.  Your rash is not better after 3 days. MAKE SURE YOU:  Understand these instructions.  Will watch your condition.  Will get help right away if you are not doing well or get worse. Document Released: 10/13/2002 Document Revised: 01/15/2012 Document Reviewed: 08/07/2011 [facility] Patient Information 2015 [place], [place]. This information is not intended to replace advice given to you by your health care provider. Make sure you discuss any  questions you have with your health care provider.

## 2015-06-03 NOTE — ED Provider Notes (Signed)
CSN: [id number]     Arrival date & time 06/03/15  1301 History   First MD Initiated Contact with Patient 06/03/15 1317     Chief Complaint  Patient presents with  . Rash   (Consider location/radiation/quality/duration/timing/severity/associated sxs/prior Treatment) HPI Comments: 34 year old female is complaining of a rash for one week. The rash is generalized and varies in appearance and different areas of body surface areas. Most of these lesions occur as small rounded mount flesh-colored lesions. Some appear as urticarial-like lesions. There are few areas of light erythema and others with flesh-colored. Patient is unsure as to what she may have taken recently in regards to medicine but denies any new medicine. She is also currently breast-feeding. Denies swelling, edema, trouble breathing, cough or wheezing.   Past Medical History  Diagnosis Date  . Diabetes mellitus without complication     gestational diabetes   Past Surgical History  Procedure Laterality Date  . Cholecystectomy  2012  . Cesarean section with bilateral tubal ligation N/A 11/11/2014    Procedure: CESAREAN SECTION WITH BILATERAL TUBAL LIGATION;  Surgeon: Levie Heritage, DO;  Location: [facility];  Service: Obstetrics;  Laterality: N/A;  REPEAT   Family History  Problem Relation Age of Onset  . Alcohol abuse Neg Hx   . Asthma Neg Hx   . Arthritis Neg Hx   . Birth defects Neg Hx   . Cancer Neg Hx   . COPD Neg Hx   . Depression Neg Hx   . Diabetes Neg Hx   . Drug abuse Neg Hx   . Early death Neg Hx   . Hearing loss Neg Hx   . Heart disease Neg Hx   . Hyperlipidemia Neg Hx   . Hypertension Neg Hx   . Kidney disease Neg Hx   . Learning disabilities Neg Hx   . Mental illness Neg Hx   . Mental retardation Neg Hx   . Miscarriages / Stillbirths Neg Hx   . Stroke Neg Hx   . Vision loss Neg Hx   . Varicose Veins Neg Hx    History  Substance Use Topics  . Smoking status: Never Smoker   . Smokeless tobacco: Not on  file  . Alcohol Use: No   OB History    Gravida Para Term Preterm AB TAB SAB Ectopic Multiple Living   0 2     Review of Systems  Constitutional: Negative.   HENT: Negative.   Respiratory: Negative.  Negative for cough and shortness of breath.   Cardiovascular: Negative.   Skin: Positive for rash.  Neurological: Negative.   Hematological: Negative.     Allergies  Review of patient's allergies indicates no known allergies.  Home Medications   Prior to Admission medications   Medication Sig Start Date End Date Taking? Authorizing Provider  glyBURIDE (DIABETA) 2.5 MG tablet Take 3.75 mg by mouth every evening.    Historical Provider, MD  glyBURIDE (DIABETA) 2.5 MG tablet Take 5 mg by mouth daily with breakfast.    Historical Provider, MD  predniSONE (DELTASONE) 20 MG tablet Take 3 tabs po on first day, 2 tabs second day, 2 tabs third day, 1 tab fourth day, 1 tab 5th day. Take with food. 06/03/15   Hayden Rasmussen, NP  prenatal vitamin w/FE, FA (PRENATAL 1 + 1) 27-1 MG TABS tablet Take 1 tablet by mouth daily at 12 noon. 07/06/14   Tereso Newcomer, MD   BP  118/82 mmHg  Pulse 94  Temp(Src) 98.5 F (36.9 C) (Oral)  Resp 16  SpO2 97%  Breastfeeding? Yes Physical Exam  Constitutional: She is oriented to person, place, and time. She appears well-nourished. No distress.  HENT:  Nose: Nose normal.  Mouth/Throat: Oropharynx is clear and moist. No oropharyngeal exudate.  Eyes: Conjunctivae and EOM are normal.  Neck: Normal range of motion. Neck supple.  Cardiovascular: Normal rate.   Pulmonary/Chest: Effort normal and breath sounds normal. No respiratory distress.  Musculoskeletal: She exhibits no edema.  Lymphadenopathy:    She has no cervical adenopathy.  Neurological: She is alert and oriented to person, place, and time. She exhibits normal muscle tone.  Skin: Skin is warm and dry.  See history of present illness for description of rash and lesions. The distribution  includes the extremities, and the torso. Fewer lesions to the lower extremities.  Nursing note and vitals reviewed.   ED Course  Procedures (including critical care time) Labs Review Labs Reviewed - No data to display  Imaging Review No results found.   MDM   1. Rash     I raised the possibility of some of the lesions appearing like insect bites but the patient emphatically disagrees. She will not entertain the idea that this is a possibility nor will she be treated for insect bites. Will treat with low-dose prednisone daily basis and the patient will be instructed not to breast-feed within 4 hours of taking the medication. She may also take loratadine for itching. Follow-up with PCP.    Hayden Rasmussen, NP 06/03/15 1339

## 2015-06-28 ENCOUNTER — Encounter (HOSPITAL_COMMUNITY): Payer: Self-pay | Admitting: Vascular Surgery

## 2015-06-28 ENCOUNTER — Emergency Department (HOSPITAL_COMMUNITY)
Admission: EM | Admit: 2015-06-28 | Discharge: 2015-06-29 | Disposition: A | Discharge status: Home / Self Care | Payer: Medicaid Other | Attending: Emergency Medicine | Admitting: Emergency Medicine

## 2015-06-28 DIAGNOSIS — Z8632 Personal history of gestational diabetes: Secondary | ICD-10-CM | POA: Insufficient documentation

## 2015-06-28 DIAGNOSIS — L509 Urticaria, unspecified: Secondary | ICD-10-CM | POA: Insufficient documentation

## 2015-06-28 DIAGNOSIS — Z79899 Other long term (current) drug therapy: Secondary | ICD-10-CM | POA: Insufficient documentation

## 2015-06-28 NOTE — ED Provider Notes (Signed)
CSN: [id number]     Arrival date & time 06/28/15  2141 History  This chart was scribed for Eyvonne Mechanic, PA-C, working with Vanetta Mulders, MD by Elon Spanner, ED Scribe. This patient was seen in room TR08C/TR08C and the patient's care was started at 11:48 PM.   Chief Complaint  Patient presents with  . Rash   The history is provided by the patient. No language interpreter was used.   HPI Comments: Elizabeth West is a 34 y.o. female with hx of gestational DM (not controlled with medication; typical high value: 150) who presents to the ED complaining of an itching, worsening rash that began on the palms and forearms and has spread over the entire body onset 1 month ago.  Associated symptoms included throat itching and intermittent, mild swelling of the upper lip.  She was seen two weeks ago at Urgent Care and prescribed 5 days of oral steroids with transient improvement.  She denies new environmental exposures including lotions/detergents as well as new foods or medications.  She currently denies difficulty breathing, swallowing, throat swelling, or any other signs of respiratory compromise.   Past Medical History  Diagnosis Date  . Diabetes mellitus without complication     gestational diabetes   Past Surgical History  Procedure Laterality Date  . Cholecystectomy  2012  . Cesarean section with bilateral tubal ligation N/A 11/11/2014    Procedure: CESAREAN SECTION WITH BILATERAL TUBAL LIGATION;  Surgeon: Levie Heritage, DO;  Location: [facility];  Service: Obstetrics;  Laterality: N/A;  REPEAT   Family History  Problem Relation Age of Onset  . Alcohol abuse Neg Hx   . Asthma Neg Hx   . Arthritis Neg Hx   . Birth defects Neg Hx   . Cancer Neg Hx   . COPD Neg Hx   . Depression Neg Hx   . Diabetes Neg Hx   . Drug abuse Neg Hx   . Early death Neg Hx   . Hearing loss Neg Hx   . Heart disease Neg Hx   . Hyperlipidemia Neg Hx   . Hypertension Neg Hx   . Kidney disease Neg Hx   .  Learning disabilities Neg Hx   . Mental illness Neg Hx   . Mental retardation Neg Hx   . Miscarriages / Stillbirths Neg Hx   . Stroke Neg Hx   . Vision loss Neg Hx   . Varicose Veins Neg Hx    Social History  Substance Use Topics  . Smoking status: Never Smoker   . Smokeless tobacco: None  . Alcohol Use: No   OB History    Gravida Para Term Preterm AB TAB SAB Ectopic Multiple Living   2 2 2       0 2     Review of Systems  All other systems reviewed and are negative.   Allergies  Review of patient's allergies indicates no known allergies.  Home Medications   Prior to Admission medications   Medication Sig Start Date End Date Taking? Authorizing Provider  glyBURIDE (DIABETA) 2.5 MG tablet Take 3.75 mg by mouth every evening.    Historical Provider, MD  glyBURIDE (DIABETA) 2.5 MG tablet Take 5 mg by mouth daily with breakfast.    Historical Provider, MD  loratadine (CLARITIN) 10 MG tablet Take 1 tablet (10 mg total) by mouth daily. 06/28/15   Eyvonne Mechanic, PA-C  predniSONE (STERAPRED UNI-PAK 21 TAB) 10 MG (21) TBPK tablet Take 1 tablet (10 mg total) by  mouth daily. Take 6 tabs by mouth daily  for 2 days, then 5 tabs for 2 days, then 4 tabs for 2 days, then 3 tabs for 2 days, 2 tabs for 2 days, then 1 tab by mouth daily for 2 days 06/29/15   Eyvonne Mechanic, PA-C  prenatal vitamin w/FE, FA (PRENATAL 1 + 1) 27-1 MG TABS tablet Take 1 tablet by mouth daily at 12 noon. 07/06/14   Tereso Newcomer, MD   BP 107/77 mmHg  Pulse 74  Temp(Src) 97.4 F (36.3 C) (Oral)  Resp 20  SpO2 97% Physical Exam  Constitutional: She is oriented to person, place, and time. She appears well-developed and well-nourished. No distress.  HENT:  Head: Normocephalic and atraumatic.  Mouth/Throat: Uvula is midline, oropharynx is clear and moist and mucous membranes are normal. No oropharyngeal exudate or posterior oropharyngeal edema.  Eyes: Conjunctivae and EOM are normal.  Neck: Normal range of motion.  Neck supple. No tracheal deviation present.  Cardiovascular: Normal rate, regular rhythm and normal heart sounds.  Exam reveals no gallop and no friction rub.   No murmur heard. Pulmonary/Chest: Effort normal and breath sounds normal. No respiratory distress. She has no wheezes. She has no rales. She exhibits no tenderness.  Musculoskeletal: Normal range of motion.  Diffuse hives throughout her entire body.    Neurological: She is alert and oriented to person, place, and time.  Skin: Skin is warm and dry.  Psychiatric: She has a normal mood and affect. Her behavior is normal.  Nursing note and vitals reviewed.   ED Course  Procedures (including critical care time)  DIAGNOSTIC STUDIES: Oxygen Saturation is 95% on RA, adequate by my interpretation.    COORDINATION OF CARE:  11:52 PM Discussed treatment plan with patient at bedside.  Patient acknowledges and agrees with plan.    Labs Review Labs Reviewed - No data to display  Imaging Review No results found. I have personally reviewed and evaluated these images and lab results as part of my medical decision-making.   EKG Interpretation None      MDM   Final diagnoses:  Urticaria     Labs:  Imaging:  Therapeutics: loratadine   Assessment/Plan: Pt presents with hives, predominance to her upper torso and extremities. She has been treated with oral prednisone with improvement of symptoms. Unable to determine cause of allergic reaction at this time. Pt notes intermittent lip swelling, no significant swelling at this time; no signs of angioedema. Pt is instructed to use medications as directed, specifically do not breastfeed until 4 hours after steroids. She is instructed to follow-up with allergist immediatly for further evaluation and management. She is given strict ED return precautions for signs or symptoms of airway involvement.   Discharge Meds: loratidine, claritin   I personally performed the services described in  this documentation, which was scribed in my presence. The recorded information has been reviewed and is accurate.  Eyvonne Mechanic, PA-C 06/30/15 1418  Vanetta Mulders, MD 07/02/15 [phone]

## 2015-06-28 NOTE — ED Notes (Signed)
Pt reports to the ED for eval of rash to entire body x 1 month. Rash is pruritic as well. She reports she had just come back from [place] when the rash started. She reports it started out on her hands and has progressed to all over her body. She was seen at the [facility] and reports she was given some medication and reports the rash will subside but it always comes back. Airway intact. Denies any SOB. Pt A&OX4, resp e/u, and skin warm and dry.

## 2015-06-29 MED ORDER — LORATADINE 10 MG PO TABS
10.0000 mg | ORAL_TABLET | Freq: Once | ORAL | Status: AC
  Administered 2015-06-29: 10 mg via ORAL
  Filled 2015-06-29: qty 1

## 2015-06-29 MED ORDER — LORATADINE 10 MG PO TABS: 10.0000 mg | ORAL_TABLET | Freq: Every day | ORAL | Status: AC

## 2015-06-29 MED ORDER — PREDNISONE 10 MG (21) PO TBPK: 10.0000 mg | ORAL_TABLET | Freq: Every day | ORAL | Status: AC

## 2015-06-29 NOTE — Discharge Instructions (Signed)
Please read attached information, please follow-up with allergist as soon as possible for further evaluation and management. If new or worsening signs or symptoms present please return immediately.

## 2015-07-09 ENCOUNTER — Ambulatory Visit: Payer: Medicaid Other | Attending: Internal Medicine

## 2015-07-19 ENCOUNTER — Ambulatory Visit: Payer: Medicaid Other | Attending: Internal Medicine

## 2015-08-09 ENCOUNTER — Emergency Department (HOSPITAL_COMMUNITY)
Admission: EM | Admit: 2015-08-09 | Discharge: 2015-08-09 | Disposition: A | Discharge status: Home / Self Care | Payer: Self-pay | Source: Home / Self Care

## 2015-08-09 ENCOUNTER — Encounter (HOSPITAL_COMMUNITY): Payer: Self-pay | Admitting: Emergency Medicine

## 2015-08-09 DIAGNOSIS — N644 Mastodynia: Secondary | ICD-10-CM

## 2015-08-09 DIAGNOSIS — Z9189 Other specified personal risk factors, not elsewhere classified: Secondary | ICD-10-CM

## 2015-08-09 NOTE — ED Provider Notes (Signed)
CSN: [id number]     Arrival date & time 08/09/15  1846 History   None    Chief Complaint  Patient presents with  . Breast Problem    bilateral   (Consider location/radiation/quality/duration/timing/severity/associated sxs/prior Treatment) The history is provided by the patient and the spouse. The history is limited by a language barrier.    Past Medical History  Diagnosis Date  . Diabetes mellitus without complication (HCC)     gestational diabetes   Past Surgical History  Procedure Laterality Date  . Cholecystectomy  2012  . Cesarean section with bilateral tubal ligation N/A 11/11/2014    Procedure: CESAREAN SECTION WITH BILATERAL TUBAL LIGATION;  Surgeon: Levie Heritage, DO;  Location: [facility];  Service: Obstetrics;  Laterality: N/A;  REPEAT   Family History  Problem Relation Age of Onset  . Alcohol abuse Neg Hx   . Asthma Neg Hx   . Arthritis Neg Hx   . Birth defects Neg Hx   . Cancer Neg Hx   . COPD Neg Hx   . Depression Neg Hx   . Diabetes Neg Hx   . Drug abuse Neg Hx   . Early death Neg Hx   . Hearing loss Neg Hx   . Heart disease Neg Hx   . Hyperlipidemia Neg Hx   . Hypertension Neg Hx   . Kidney disease Neg Hx   . Learning disabilities Neg Hx   . Mental illness Neg Hx   . Mental retardation Neg Hx   . Miscarriages / Stillbirths Neg Hx   . Stroke Neg Hx   . Vision loss Neg Hx   . Varicose Veins Neg Hx    Social History  Substance Use Topics  . Smoking status: Never Smoker   . Smokeless tobacco: None  . Alcohol Use: No   OB History    Gravida Para Term Preterm AB TAB SAB Ectopic Multiple Living   0 2     Review of Systems  Constitutional: Negative.   HENT: Negative.   Eyes: Negative.   Respiratory: Negative.   Cardiovascular: Negative.   Gastrointestinal: Negative.   Endocrine: Negative.   Genitourinary: Negative.   Skin: Positive for rash.       Bilateral nipple and breast discomfort  Allergic/Immunologic: Negative.    Neurological: Negative.   Hematological: Negative.   Psychiatric/Behavioral: Negative.     Allergies  Review of patient's allergies indicates no known allergies.  Home Medications   Prior to Admission medications   Medication Sig Start Date End Date Taking? Authorizing Provider  glyBURIDE (DIABETA) 2.5 MG tablet Take 3.75 mg by mouth every evening.    Historical Provider, MD  glyBURIDE (DIABETA) 2.5 MG tablet Take 5 mg by mouth daily with breakfast.    Historical Provider, MD  loratadine (CLARITIN) 10 MG tablet Take 1 tablet (10 mg total) by mouth daily. 06/28/15   Eyvonne Mechanic, PA-C  predniSONE (STERAPRED UNI-PAK 21 TAB) 10 MG (21) TBPK tablet Take 1 tablet (10 mg total) by mouth daily. Take 6 tabs by mouth daily  for 2 days, then 5 tabs for 2 days, then 4 tabs for 2 days, then 3 tabs for 2 days, 2 tabs for 2 days, then 1 tab by mouth daily for 2 days 06/29/15   Eyvonne Mechanic, PA-C  prenatal vitamin w/FE, FA (PRENATAL 1 + 1) 27-1 MG TABS tablet Take 1 tablet by mouth daily at 12 noon. 07/06/14  Tereso Newcomer, MD   Meds Ordered and Administered this Visit  Medications - No data to display  BP 114/76 mmHg  Pulse 76  Temp(Src) 97.4 F (36.3 C) (Oral)  Resp 16  SpO2 100%  Breastfeeding? Yes No data found.   Physical Exam  Constitutional: She appears well-developed and well-nourished.  HENT:  Head: Normocephalic.  Eyes: Conjunctivae and EOM are normal. Pupils are equal, round, and reactive to light.  Neck: Normal range of motion.  Cardiovascular: Normal rate, regular rhythm and normal heart sounds.   Pulmonary/Chest: Effort normal.  Abdominal: Soft. Bowel sounds are normal.  Skin:  Bilateral breast with edema, no erythema, chaffing and dry skin on nipples, no mastitis.    ED Course  Procedures (including critical care time)  Labs Review Labs Reviewed - No data to display  Imaging Review No results found.   Visual Acuity Review  Right Eye Distance:   Left Eye  Distance:   Bilateral Distance:    Right Eye Near:   Left Eye Near:    Bilateral Near:         MDM   1. Breastfeeding problem   2. Breast pain    Recommend [facility] Recommend utter cream otc    Deatra Canter, FNP 08/09/15 2142

## 2015-08-09 NOTE — Discharge Instructions (Signed)

## 2015-08-09 NOTE — ED Notes (Addendum)
Pt has been having severe pain in her nipples for about one week.  She has not been able to nurse her son for the last two days.  Her son was treated here one week ago for thrush and she was sent home with a prescription as well.  The son is doing better, but her pain has been enough that it makes her cry when she is nursing, so her husband recommended she stop and come here.

## 2015-08-20 IMAGING — US US OB FOLLOW-UP
1 series · 12 of 28 positions shown · non-contrast
Comparison: none

[Series 1: us ob follow-up · 0.26mm/px · 12 of 44 slices shown]
[im 2/44]
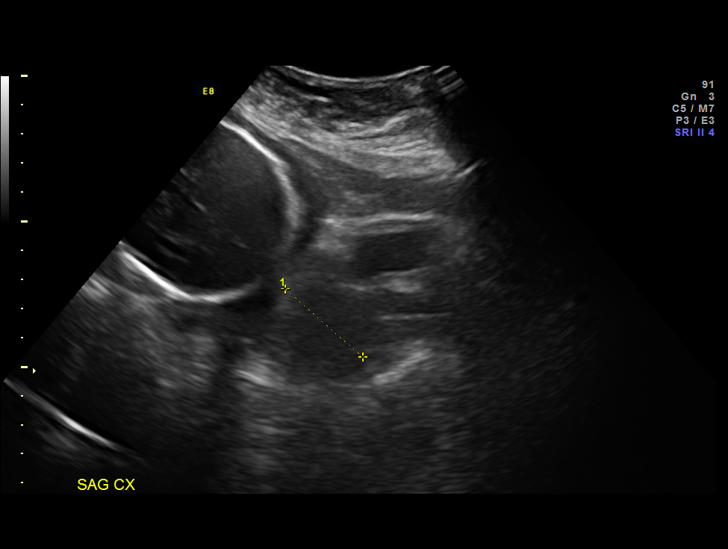
[im 5/44]
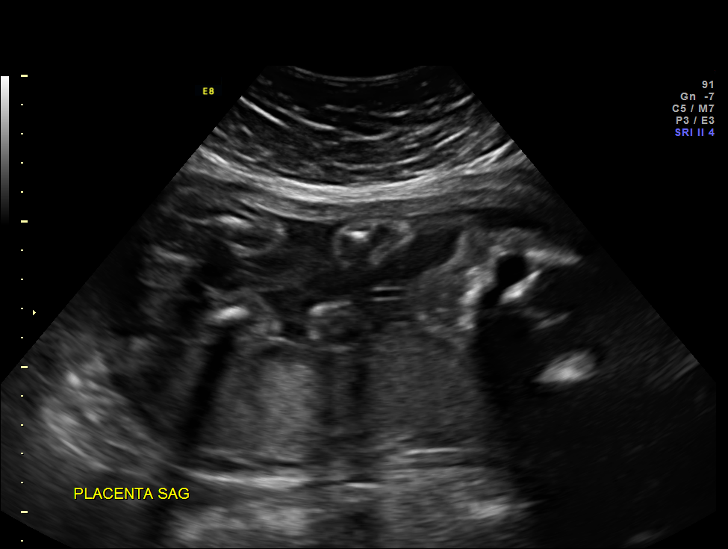
[im 8/44]
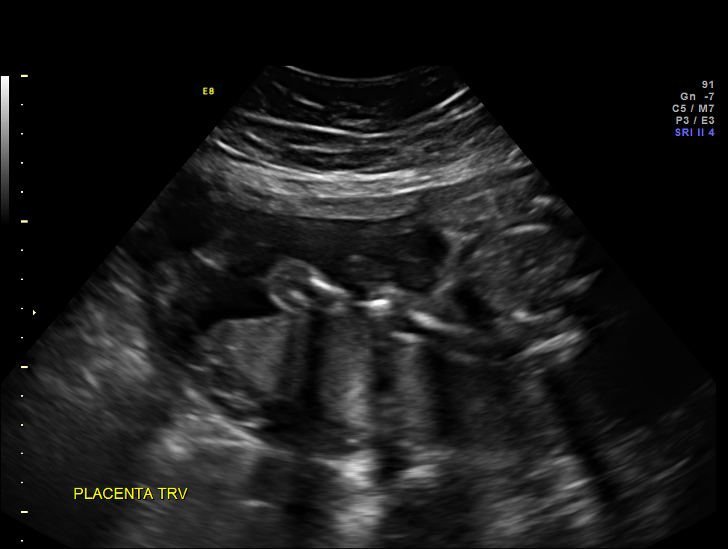
[im 13/44]
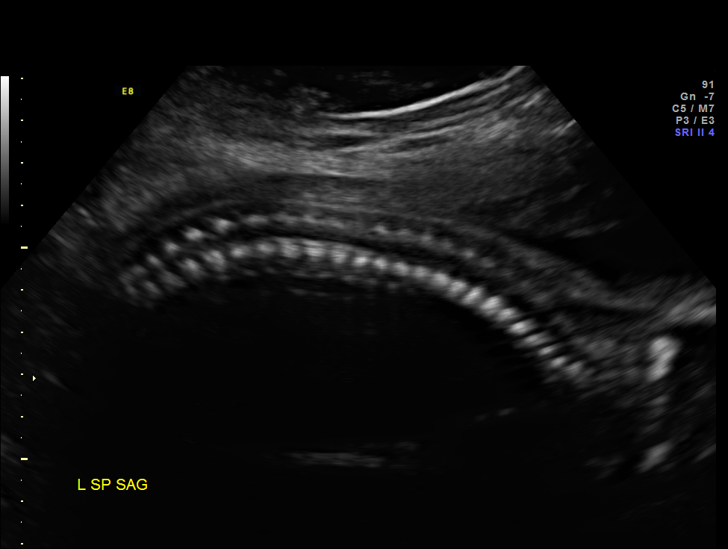
[im 16/44]
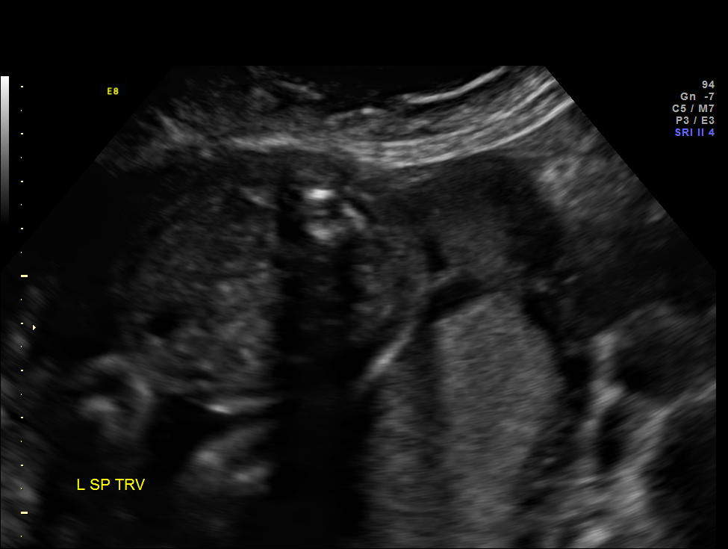
[im 20/44]
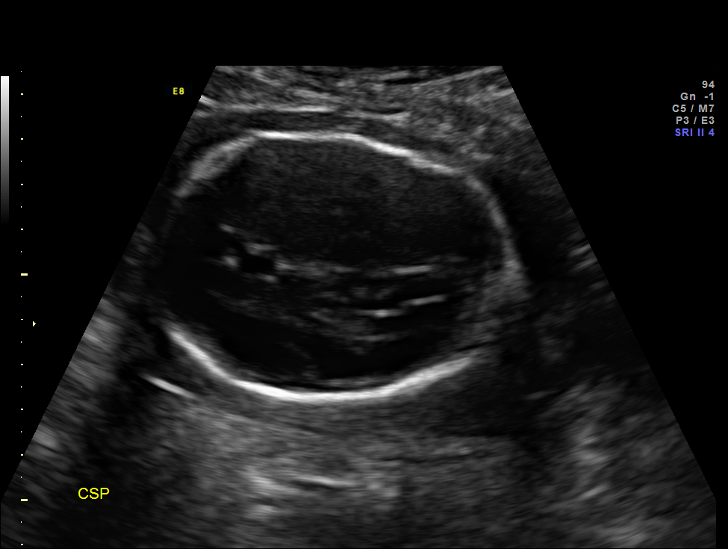
[im 24/44]
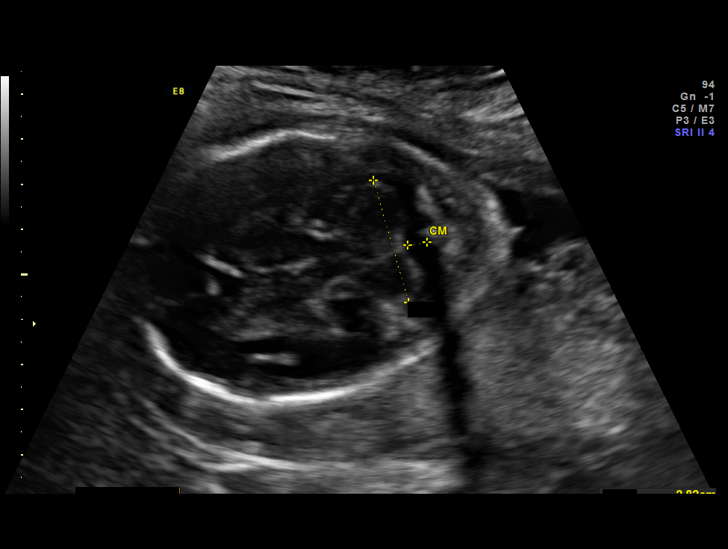
[im 28/44]
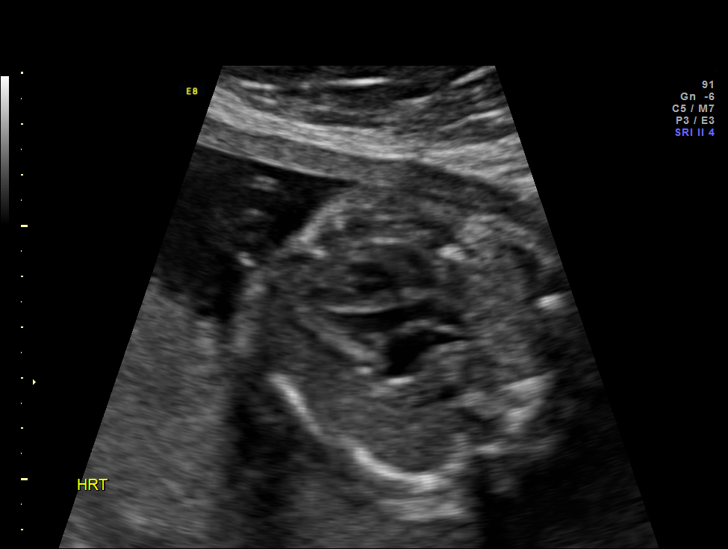
[im 31/44]
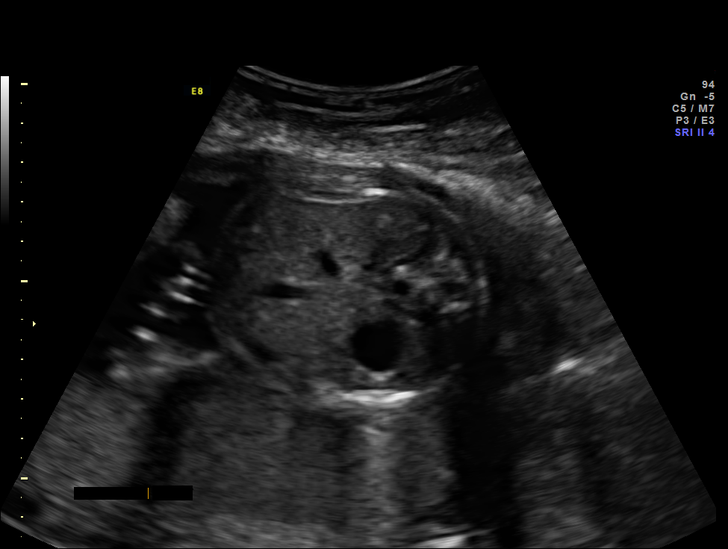
[im 36/44]
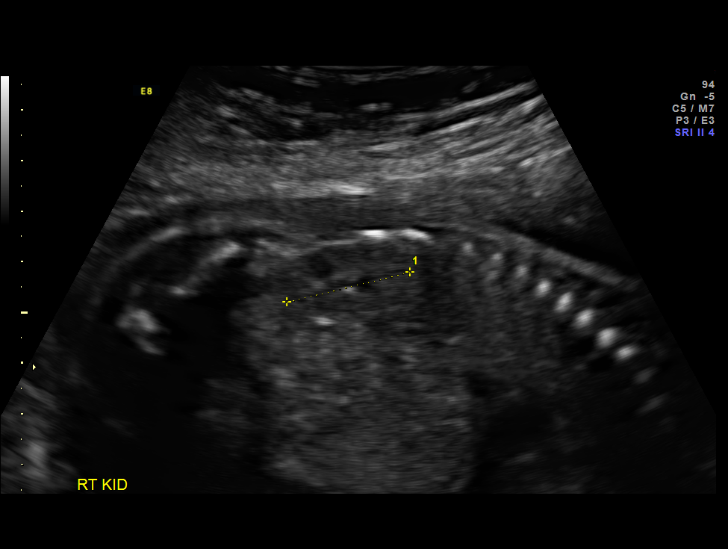
[im 39/44]
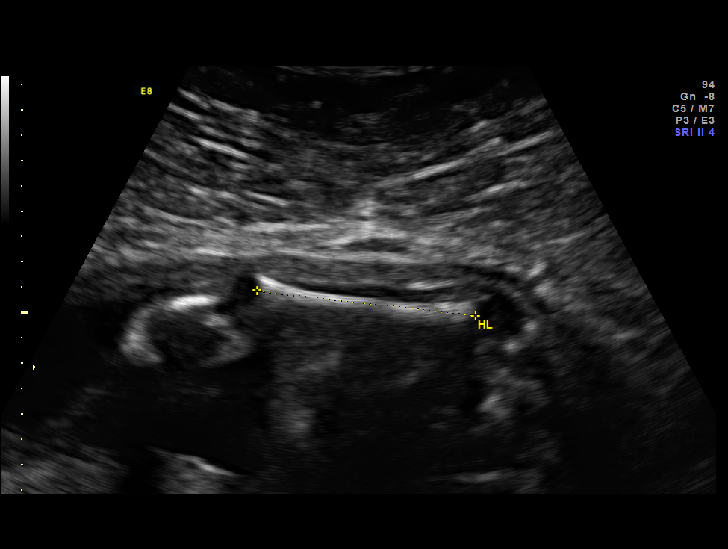
[im 42/44]
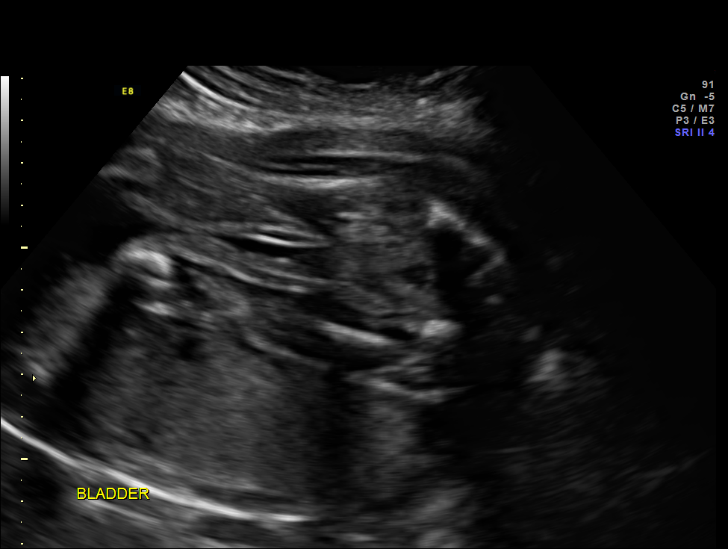

[12 of 28 positions shown; findings below may reference images not displayed]

OBSTETRICS REPORT
                      (Signed Final 08/05/2014 [DATE])

Service(s) Provided

 US OB FOLLOW UP                                       76816.1
Indications

 Fetal abnormality - other known: increased NT and
 NF - low risk NIPS
 Diabetes - Pregestational (on Glyburide)
 Previous cesarean section
 Poor obstetric history: Previous macrosomia
Fetal Evaluation

 Num Of Fetuses:    1
 Fetal Heart Rate:  141                          bpm
 Cardiac Activity:  Observed
 Presentation:      Cephalic
 Placenta:          Posterior, above cervical
                    os
 P. Cord            Previously Visualized
 Insertion:

 Amniotic Fluid
 AFI FV:      Subjectively low-normal
                                             Larg Pckt:     4.3  cm
Biometry

 BPD:     57.4  mm     G. Age:  23w 4d                CI:         70.4   70 - 86
 OFD:     81.5  mm                                    FL/HC:      19.8   18.7 -

 HC:     224.5  mm     G. Age:  24w 3d       15  %    HC/AC:      1.08   1.04 -

 AC:     208.1  mm     G. Age:  25w 3d       53  %    FL/BPD:     77.5   71 - 87
 FL:      44.5  mm     G. Age:  24w 5d       26  %    FL/AC:      21.4   20 - 24
 HUM:     43.8  mm     G. Age:  26w 0d       70  %
 CER:     28.2  mm     G. Age:  25w 1d       53  %

 Est. FW:     750  gm    1 lb 10 oz      52  %
Gestational Age

 LMP:           25w 0d        Date:  02/11/14                 EDD:   11/18/14
 U/S Today:     24w 4d                                        EDD:   11/21/14
 Best:          25w 0d     Det. By:  LMP  (02/11/14)          EDD:   11/18/14
Anatomy

 Cranium:          Appears normal         Aortic Arch:      Previously seen
 Fetal Cavum:      Appears normal         Ductal Arch:      Previously seen
 Ventricles:       Appears normal         Diaphragm:        Previously seen
 Choroid Plexus:   Previously seen        Stomach:          Appears normal, left
                                                            sided
 Cerebellum:       Appears normal         Abdomen:          Appears normal
 Posterior Fossa:  Appears normal         Abdominal Wall:   Previously seen
 Nuchal Fold:      Previously             Cord Vessels:     Appears normal (3
                   enlarged
                                                            vessel cord)
 Face:             Orbits and profile     Kidneys:          Appear normal
                   previously seen
 Lips:             Previously seen        Bladder:          Appears normal
 Heart:            Appears normal         Spine:            Appears normal
                   (4CH, axis, and
                   situs)
 RVOT:             Appears normal         Lower             Previously seen
                                          Extremities:
 LVOT:             Appears normal         Upper             Previously seen
                                          Extremities:

 Other:  Fetus appears to be a male. Heels previously visualized. Nasal bone
         previously visualized.
Targeted Anatomy

 Fetal Central Nervous System
 Cisterna Magna:
Cervix Uterus Adnexa

 Cervical Length:    3.5      cm

 Cervix:       Normal appearance by transabdominal scan.

 Adnexa:     No abnormality visualized.
Impression

 Single IUP at 25w 0d
 Likely pregestational diabetes on glyburide, thickend NT (low
 risk for aneuploidy by NIPS)- fetal echo scheduled [DATE]
 Normal interval anatomy
 Fetal growth is appropriate (52nd %tile)
 Normal amniotic fluid volume
Recommendations

 Recommend follow-up ultrasound examination in 4 weeks.

## 2015-10-23 IMAGING — US US OB FOLLOW-UP
1 series · 12 of 28 positions shown · non-contrast
Comparison: none

OBSTETRICS REPORT
                      (Signed Final 10/08/2014 [DATE])

Service(s) Provided
 US OB FOLLOW UP                                       76816.1
Indications
 Fetal abnormality - other known: increased NT and
 NF - low risk NIPS
 Pre-existing diabetes, type 2, in pregnancy, third
 trimester (on glyburide)
 History of cesarean delivery, currently pregnant
 Poor obstetric history: Previous macrosomia
 34 weeks gestation of pregnancy
Fetal Evaluation
 Num Of Fetuses:    1
 Fetal Heart Rate:  169                          bpm
 Cardiac Activity:  Observed
 Presentation:      Cephalic
 Placenta:          Posterior Fundal, above
                    cervical os
 P. Cord            Previously Visualized
 Insertion:
 Amniotic Fluid
 AFI FV:      Subjectively within normal limits
 AFI Sum:     13.55   cm       45  %Tile     Larg Pckt:     5.9  cm
 RUQ:   4.67    cm   LUQ:    2.98   cm    LLQ:   5.9     cm
Biometry
 BPD:     81.2  mm     G. Age:  32w 4d                CI:         73.6   70 - 86
 OFD:    110.4  mm                                    FL/HC:      21.3   19.4 -
 HC:     306.4  mm     G. Age:  34w 1d       17  %    HC/AC:      0.91   0.96 -
 AC:     336.7  mm     G. Age:  37w 4d     > 97  %    FL/BPD:     80.3   71 - 87
 FL:      65.2  mm     G. Age:  33w 4d       27  %    FL/AC:      19.4   20 - 24
 HUM:       60  mm     G. Age:  34w 6d       71  %
 Est. FW:    0428  gm      6 lb 1 oz     83  %
Gestational Age
 LMP:           34w 1d        Date:  02/11/14                 EDD:   11/18/14
 U/S Today:     34w 3d                                        EDD:   11/16/14
 Best:          34w 1d     Det. By:  LMP  (02/11/14)          EDD:   11/18/14
Anatomy
 Cranium:          Previously seen        Aortic Arch:      Previously seen
 Fetal Cavum:      Previously seen        Ductal Arch:      Previously seen
 Ventricles:       Appears normal         Diaphragm:        Appears normal
 Choroid Plexus:   Previously seen        Stomach:          Appears normal, left
                                                            sided
 Cerebellum:       Previously seen        Abdomen:          Previously seen
 Posterior Fossa:  Previously seen        Abdominal Wall:   Previously seen
 Nuchal Fold:      Previously             Cord Vessels:     Previously seen
                   enlarged
 Face:             Orbits and profile     Kidneys:          Appear normal
                   previously seen
 Lips:             Previously seen        Bladder:          Appears normal
 Heart:            Appears normal         Spine:            Previously seen
                   (4CH, axis, and
                   situs)
 RVOT:             Previously seen        Lower             Previously seen
                                          Extremities:
 LVOT:             Previously seen        Upper             Previously seen
 Other:  Fetus appears to be a male. Heels previously visualized.
Cervix Uterus Adnexa
 Cervix:       Not visualized (advanced GA >28wks)
Impression
INDICATION: 32 yr old [id number] at 03w0d with gestational
 diabetes vs type II diabetes and previous finding of thickened
 nuchal translucency for fetal growth.

[Series 1: us ob follow-up · 12 of 30 slices shown]
[im 2/30]
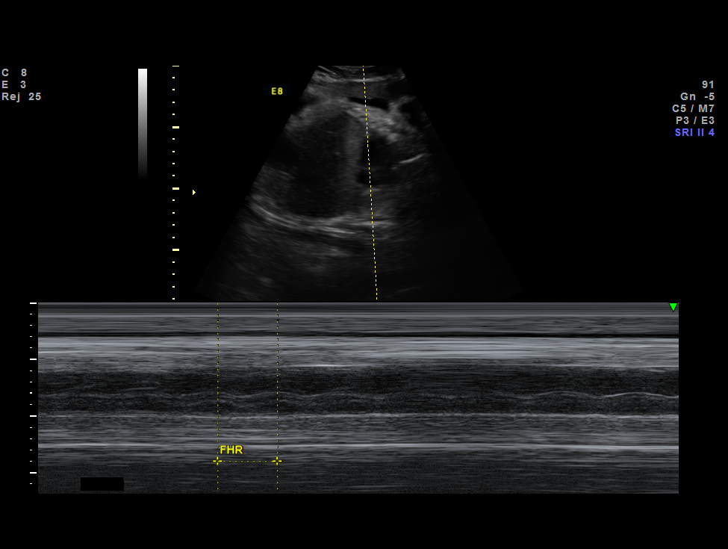
[im 4/30]
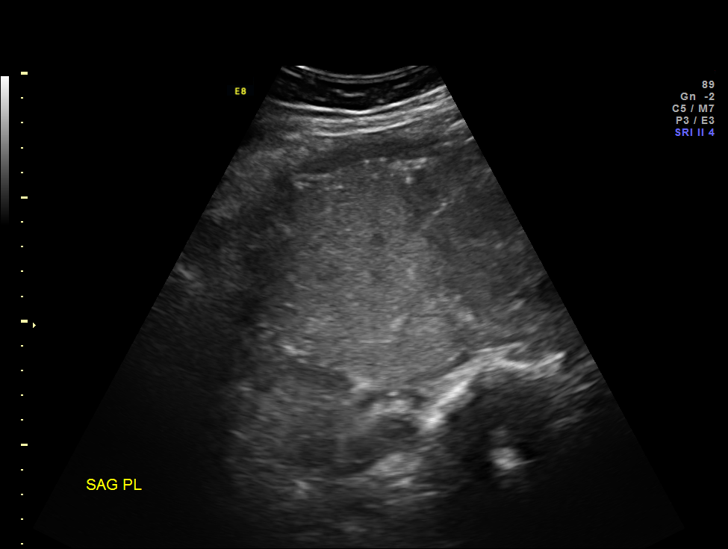
[im 6/30]
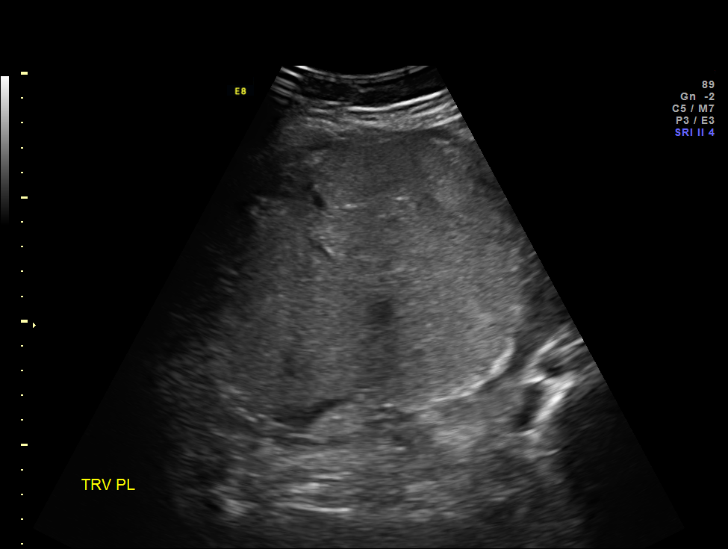
[im 9/30]
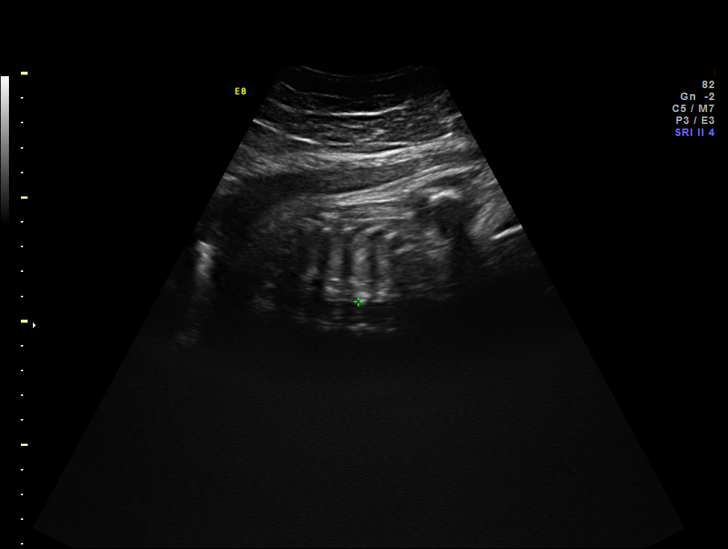
[im 11/30]
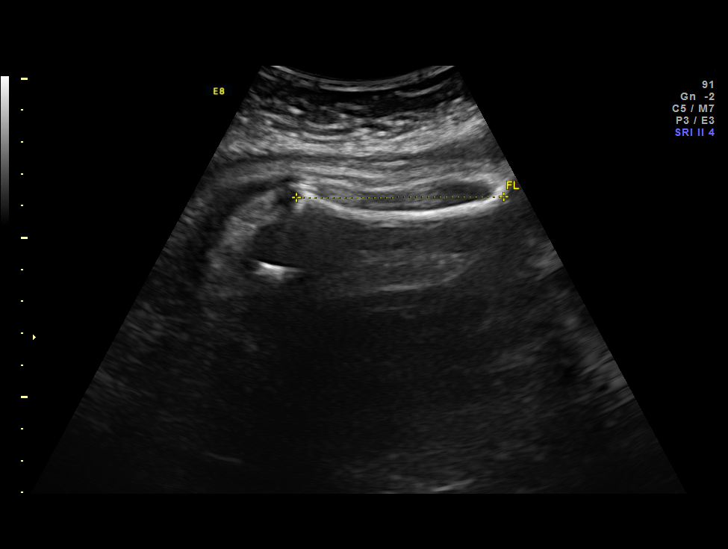
[im 13/30]
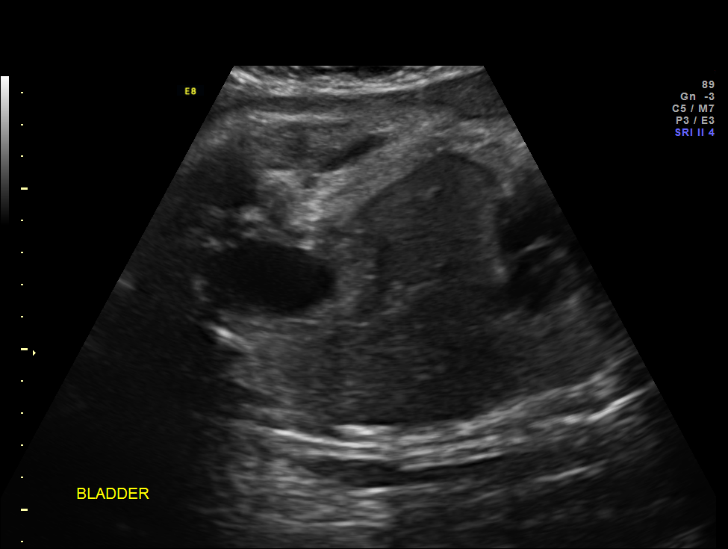
[im 17/30]
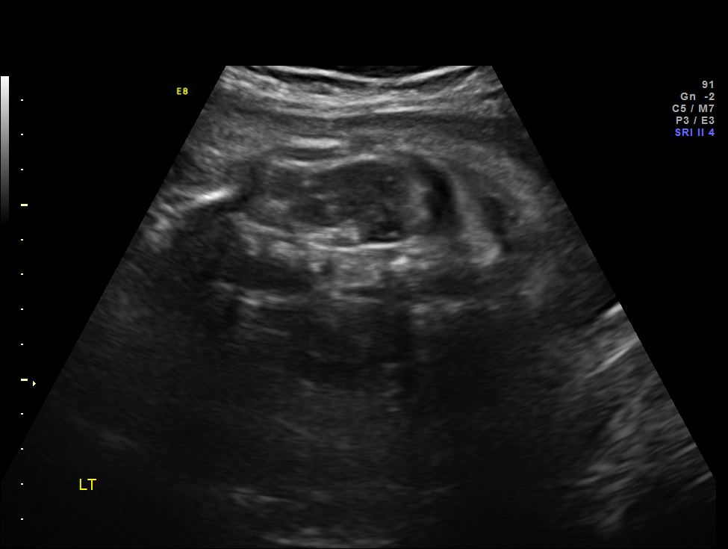
[im 19/30]
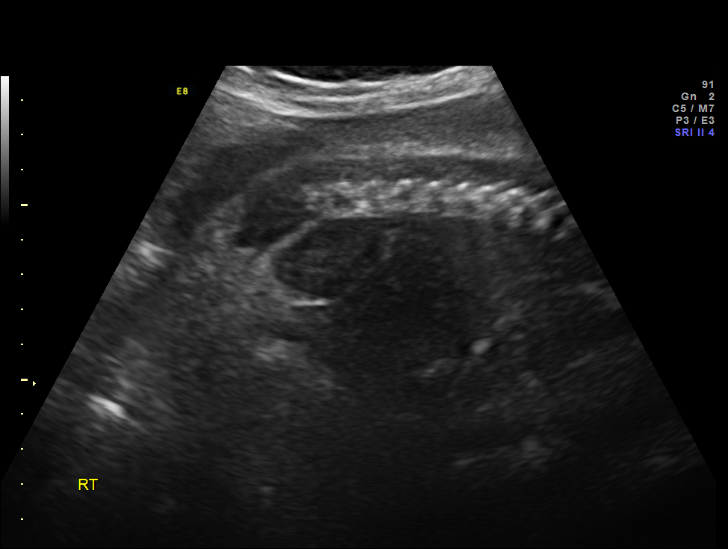
[im 21/30]
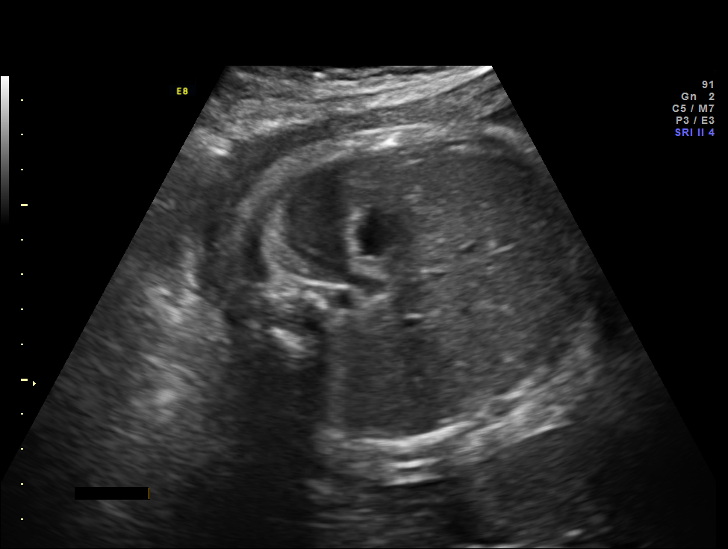
[im 24/30]
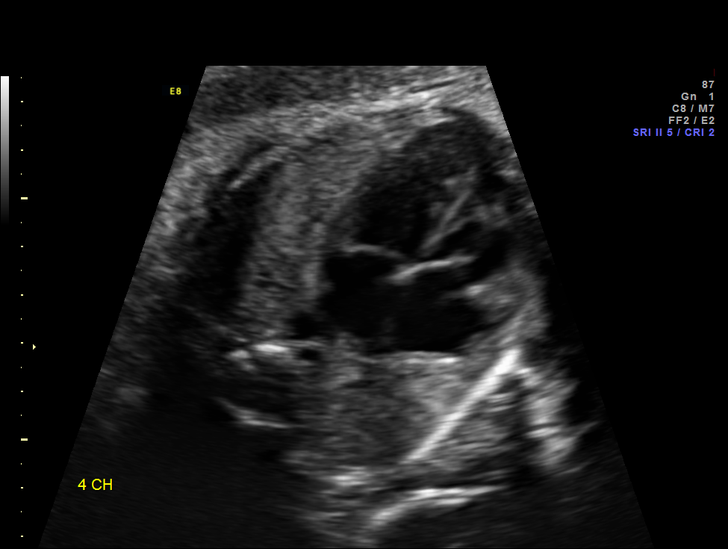
[im 26/30]
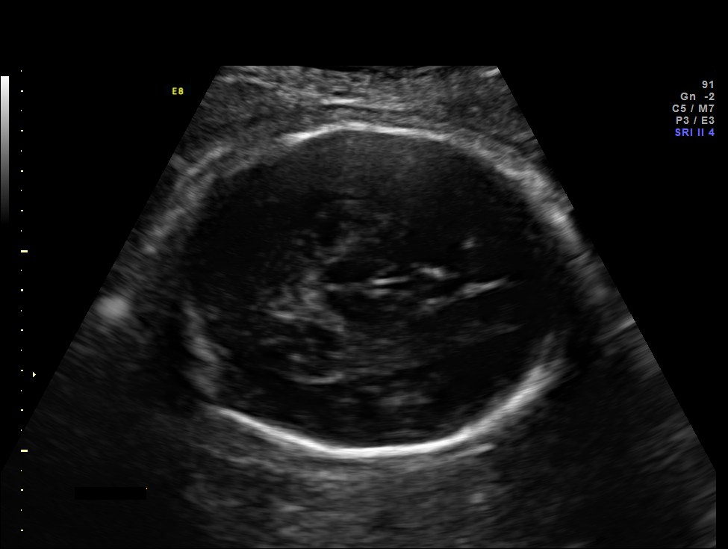
[im 28/30]
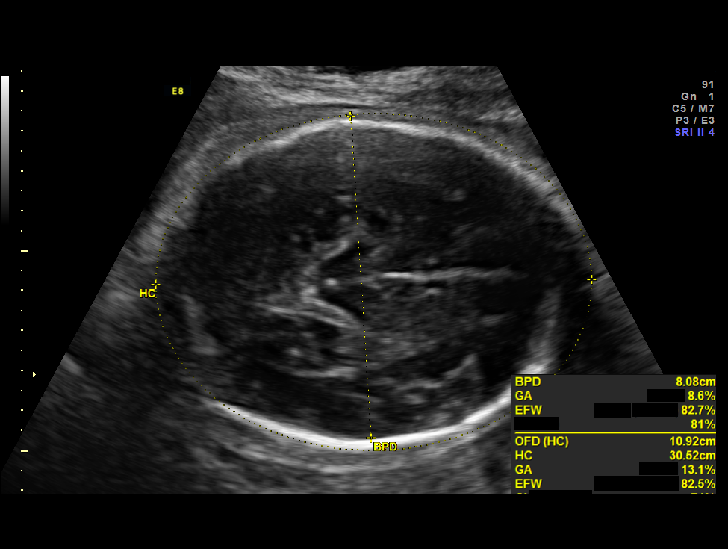

[12 of 28 positions shown; findings below may reference images not displayed]

FINDINGS: 1. Active single intrauterine pregnancy.
 2. Estimated fetal weight is in the 83rd%.
 3. Posterior placenta without evidence of previa.
 4. Normal amniotic fluid index.
 5. The limited anatomy survey is normal.
Recommendations

 Continue antenatal testing as scheduled (2x weekly NSTs
 with weekly AFIs)
 Recommend delivery at 39 weeks in the absence of other
 complications

## 2016-09-13 ENCOUNTER — Emergency Department (HOSPITAL_COMMUNITY)
Admission: EM | Admit: 2016-09-13 | Discharge: 2016-09-14 | Disposition: A | Discharge status: Home / Self Care | Payer: Medicaid Other | Attending: Emergency Medicine | Admitting: Emergency Medicine

## 2016-09-13 ENCOUNTER — Encounter (HOSPITAL_COMMUNITY): Payer: Self-pay

## 2016-09-13 DIAGNOSIS — N390 Urinary tract infection, site not specified: Secondary | ICD-10-CM | POA: Insufficient documentation

## 2016-09-13 DIAGNOSIS — E119 Type 2 diabetes mellitus without complications: Secondary | ICD-10-CM | POA: Insufficient documentation

## 2016-09-13 DIAGNOSIS — R109 Unspecified abdominal pain: Secondary | ICD-10-CM

## 2016-09-13 DIAGNOSIS — Z7984 Long term (current) use of oral hypoglycemic drugs: Secondary | ICD-10-CM | POA: Insufficient documentation

## 2016-09-13 LAB — CBC WITH DIFFERENTIAL/PLATELET
BASOS ABS: 0 10*3/uL (ref 0.0–0.1)
Basophils Relative: 0 %
EOS PCT: 2 %
Eosinophils Absolute: 0.2 10*3/uL (ref 0.0–0.7)
HEMATOCRIT: 41.3 % (ref 36.0–46.0)
HEMOGLOBIN: 13.3 g/dL (ref 12.0–15.0)
LYMPHS ABS: 2.5 10*3/uL (ref 0.7–4.0)
LYMPHS PCT: 29 %
MCH: 25.4 pg — ABNORMAL LOW (ref 26.0–34.0)
MCHC: 32.2 g/dL (ref 30.0–36.0)
MCV: 78.8 fL (ref 78.0–100.0)
Monocytes Absolute: 0.4 10*3/uL (ref 0.1–1.0)
Monocytes Relative: 4 %
NEUTROS ABS: 5.6 10*3/uL (ref 1.7–7.7)
NEUTROS PCT: 65 %
PLATELETS: 252 10*3/uL (ref 150–400)
RBC: 5.24 MIL/uL — AB (ref 3.87–5.11)
RDW: 13.5 % (ref 11.5–15.5)
WBC: 8.6 10*3/uL (ref 4.0–10.5)

## 2016-09-13 LAB — BASIC METABOLIC PANEL
ANION GAP: 6 (ref 5–15)
BUN: 9 mg/dL (ref 6–20)
CHLORIDE: 110 mmol/L (ref 101–111)
CO2: 23 mmol/L (ref 22–32)
Calcium: 9 mg/dL (ref 8.9–10.3)
Creatinine, Ser: 0.53 mg/dL (ref 0.44–1.00)
Glucose, Bld: 125 mg/dL — ABNORMAL HIGH (ref 65–99)
POTASSIUM: 3.6 mmol/L (ref 3.5–5.1)
SODIUM: 139 mmol/L (ref 135–145)

## 2016-09-13 LAB — URINE MICROSCOPIC-ADD ON

## 2016-09-13 LAB — URINALYSIS, ROUTINE W REFLEX MICROSCOPIC
Bilirubin Urine: NEGATIVE
Glucose, UA: NEGATIVE mg/dL
Ketones, ur: NEGATIVE mg/dL
Nitrite: NEGATIVE
PROTEIN: 30 mg/dL — AB
SPECIFIC GRAVITY, URINE: 1.005 (ref 1.005–1.030)
pH: 6.5 (ref 5.0–8.0)

## 2016-09-13 LAB — I-STAT BETA HCG BLOOD, ED (MC, WL, AP ONLY): I-stat hCG, quantitative: <5 m[IU]/mL (ref <5)

## 2016-09-13 NOTE — ED Triage Notes (Signed)
Pt states that since yesterday has had L sided flank pain, and dysuria, denies hematuria, denies nausea.

## 2016-09-13 NOTE — ED Provider Notes (Signed)
[facility]-EMER[facility]ENCY DEPT Provider Note   CSN: [id number] Arrival date & time: 09/13/16  2200     History   Chief Complaint Chief Complaint  Patient presents with  . Flank Pain    HPI Elizabeth West is a 35 y.o. female.  She presents for evaluation of dysuria and painful urination, present for 2 days. She denies urinary frequency, hematuria, vaginal bleeding, or vaginal discharge. Her last menstrual period was 3 weeks ago. She has had intermittent pain in the left flank and left lower quadrant for 1-1/2 years, as well as intermittent left chest pain for that same time. The painful distance started after she was diagnosed and treated for diabetes with metformin. She has not talked to her primary care doctor about these problems. There are no other known modifying factors.  HPI  Past Medical History:  Diagnosis Date  . Diabetes mellitus without complication (HCC)    gestational diabetes    Patient Active Problem List   Diagnosis Date Noted  . Diabetes mellitus (HCC) 12/29/2014  . History of gestational diabetes mellitus, not currently pregnant 12/23/2014    Past Surgical History:  Procedure Laterality Date  . CESAREAN SECTION WITH BILATERAL TUBAL LIGATION N/A 11/11/2014   Procedure: CESAREAN SECTION WITH BILATERAL TUBAL LIGATION;  Surgeon: Levie Heritage, DO;  Location: [facility];  Service: Obstetrics;  Laterality: N/A;  REPEAT  . CHOLECYSTECTOMY  2012    OB History    Gravida Para Term Preterm AB Living   2 2 2     2    SAB TAB Ectopic Multiple Live Births         0 2       Home Medications    Prior to Admission medications   Medication Sig Start Date End Date Taking? Authorizing Provider  metFORMIN (GLUCOPHAGE-XR) 500 MG 24 hr tablet Take 500 mg by mouth 2 (two) times daily. 08/13/16  Yes Historical Provider, MD  cephALEXin (KEFLEX) 500 MG capsule Take 1 capsule (500 mg total) by mouth 4 (four) times daily. 09/14/16   Mancel Bale, MD  loratadine (CLARITIN) 10 MG  tablet Take 1 tablet (10 mg total) by mouth daily. Patient not taking: Reported on 09/13/2016 06/28/15   Eyvonne Mechanic, PA-C  predniSONE (STERAPRED UNI-PAK 21 TAB) 10 MG (21) TBPK tablet Take 1 tablet (10 mg total) by mouth daily. Take 6 tabs by mouth daily  for 2 days, then 5 tabs for 2 days, then 4 tabs for 2 days, then 3 tabs for 2 days, 2 tabs for 2 days, then 1 tab by mouth daily for 2 days Patient not taking: Reported on 09/13/2016 06/29/15   Eyvonne Mechanic, PA-C  prenatal vitamin w/FE, FA (PRENATAL 1 + 1) 27-1 MG TABS tablet Take 1 tablet by mouth daily at 12 noon. Patient not taking: Reported on 09/13/2016 07/06/14   Tereso Newcomer, MD    Family History Family History  Problem Relation Age of Onset  . Alcohol abuse Neg Hx   . Asthma Neg Hx   . Arthritis Neg Hx   . Birth defects Neg Hx   . Cancer Neg Hx   . COPD Neg Hx   . Depression Neg Hx   . Diabetes Neg Hx   . Drug abuse Neg Hx   . Early death Neg Hx   . Hearing loss Neg Hx   . Heart disease Neg Hx   . Hyperlipidemia Neg Hx   . Hypertension Neg Hx   . Kidney disease Neg Hx   .  Learning disabilities Neg Hx   . Mental illness Neg Hx   . Mental retardation Neg Hx   . Miscarriages / Stillbirths Neg Hx   . Stroke Neg Hx   . Vision loss Neg Hx   . Varicose Veins Neg Hx     Social History Social History  Substance Use Topics  . Smoking status: Never Smoker  . Smokeless tobacco: Never Used  . Alcohol use No     Allergies   Patient has no known allergies.   Review of Systems Review of Systems  All other systems reviewed and are negative.    Physical Exam Updated Vital Signs BP 103/72   Pulse 89   Temp 97.8 F (36.6 C) (Oral)   Resp 19   Ht 5' (1.524 m)   Wt 136 lb (61.7 kg)   LMP 08/23/2016   SpO2 100%   BMI 26.56 kg/m   Physical Exam  Constitutional: She is oriented to person, place, and time. She appears well-developed and well-nourished. No distress.  HENT:  Head: Normocephalic and atraumatic.    Eyes: Conjunctivae and EOM are normal. Pupils are equal, round, and reactive to light.  Neck: Normal range of motion and phonation normal. Neck supple.  Cardiovascular: Normal rate and regular rhythm.   Pulmonary/Chest: Effort normal and breath sounds normal. No respiratory distress. She exhibits no tenderness.  Abdominal: Soft. She exhibits no distension. There is no tenderness. There is no guarding.  Genitourinary:  Genitourinary Comments: No costovertebral angle tenderness with percussion  Musculoskeletal: Normal range of motion.  Neurological: She is alert and oriented to person, place, and time. She exhibits normal muscle tone.  Skin: Skin is warm and dry.  Psychiatric: She has a normal mood and affect. Her behavior is normal. Judgment and thought content normal.  Nursing note and vitals reviewed.    ED Treatments / Results  Labs (all labs ordered are listed, but only abnormal results are displayed) Labs Reviewed  URINALYSIS, ROUTINE W REFLEX MICROSCOPIC (NOT AT [facility]) - Abnormal; Notable for the following:       Result Value   APPearance CLOUDY (*)    Hgb urine dipstick LARGE (*)    Protein, ur 30 (*)    Leukocytes, UA SMALL (*)    All other components within normal limits  BASIC METABOLIC PANEL - Abnormal; Notable for the following:    Glucose, Bld 125 (*)    All other components within normal limits  CBC WITH DIFFERENTIAL/PLATELET - Abnormal; Notable for the following:    RBC 5.24 (*)    MCH 25.4 (*)    All other components within normal limits  URINE MICROSCOPIC-ADD ON - Abnormal; Notable for the following:    Squamous Epithelial / LPF 0-5 (*)    Bacteria, UA RARE (*)    All other components within normal limits  POC URINE PREG, ED  I-STAT BETA HCG BLOOD, ED ([facility], [facility], [facility] ONLY)    EKG  EKG Interpretation  Date/Time:  Wednesday September 13 2016 23:04:49 EST Ventricular Rate:  92 PR Interval:    QRS Duration: 87 QT Interval:  373 QTC Calculation: 462 R  Axis:   -24 Text Interpretation:  Sinus rhythm Borderline left axis deviation Low voltage, precordial leads No old tracing to compare Confirmed by [facility]  MD, Kinney Sackmann ([id number]) on 09/13/2016 11:36:40 PM       Radiology No results found.  Procedures Procedures (including critical care time)  Medications Ordered in ED Medications - No data to display  Initial Impression / Assessment and Plan / ED Course  I have reviewed the triage vital signs and the nursing notes.  Pertinent labs & imaging results that were available during my care of the patient were reviewed by me and considered in my medical decision making (see chart for details).  Clinical Course     Medications - No data to display  Patient Vitals for the past 24 hrs:  BP Temp Temp src Pulse Resp SpO2 Height Weight  09/13/16 2306 103/72 - - 89 19 100 % - -  09/13/16 2300 109/92 - - 91 - 100 % - -  09/13/16 2256 - - - - - 100 % - -  09/13/16 2245 - - - 99 - 99 % - -  09/13/16 2239 116/74 - - 106 - 99 % - -  09/13/16 2207 110/84 97.8 F (36.6 C) Oral 97 16 97 % - -  09/13/16 2206 - - - - - - 5' (1.524 m) 136 lb (61.7 kg)    12:07 AM Reevaluation with update and discussion. After initial assessment and treatment, an updated evaluation reveals She is comfortable. Findings discussed with patient and husband, all questions answered. Xiamara Hulet L    Final Clinical Impressions(s) / ED Diagnoses   Final diagnoses:  Lower urinary tract infectious disease  Flank pain   Nonspecific flank pain, and chest pain, present for greater than one year. He has increased flank pain with menses, making endometriosis a distinct possibility. Her more recent symptoms of dysuria are likely secondary to cystitis. Doubt pyelonephritis, metabolic instability or impending vascular collapse.  Nursing Notes Reviewed/ Care Coordinated Applicable Imaging Reviewed Interpretation of Laboratory Data incorporated into ED treatment  The patient  appears reasonably screened and/or stabilized for discharge and I doubt any other medical condition or other [facility] requiring further screening, evaluation, or treatment in the ED at this time prior to discharge.  Plan: Home Medications- continue; Home Treatments- rest, fluids; return here if the recommended treatment, does not improve the symptoms; Recommended follow up- GYN for check up 1-2 weeks   New Prescriptions New Prescriptions   CEPHALEXIN (KEFLEX) 500 MG CAPSULE    Take 1 capsule (500 mg total) by mouth 4 (four) times daily.     Mancel BaleElliott Osaze Hubbert, MD 09/14/16 [phone]

## 2016-09-14 MED ORDER — CEPHALEXIN 500 MG PO CAPS: 500.0000 mg | ORAL_CAPSULE | Freq: Four times a day (QID) | ORAL | 0 refills | Status: AC

## 2016-09-14 NOTE — Discharge Instructions (Signed)
Continue to drink plenty of water.  For pain, use ibuprofen or acetaminophen  Since you're having flank pain with menses, you may have endometriosis. You should check this out by seeing a gynecologist.

## 2017-08-23 ENCOUNTER — Encounter

## 2017-08-23 ENCOUNTER — Ambulatory Visit: Admit: 2017-08-23 | Discharge: 2017-08-23 | Payer: MEDICAID | Attending: Family Health | Primary: Family Medicine

## 2017-08-23 DIAGNOSIS — Z7689 Persons encountering health services in other specified circumstances: Secondary | ICD-10-CM

## 2017-08-23 LAB — POCT URINALYSIS DIPSTICK
Bilirubin, UA: NEGATIVE
Blood, UA POC: NEGATIVE
Glucose, UA POC: NEGATIVE
Ketones, UA: NEGATIVE
Leukocytes, UA: NEGATIVE
Nitrite, UA: NEGATIVE
Protein, UA POC: NEGATIVE
Spec Grav, UA: 1.01
Urobilinogen, UA: NEGATIVE
pH, UA: 5.5

## 2017-08-23 MED ORDER — METFORMIN HCL ER 500 MG PO TB24
500 | ORAL_TABLET | Freq: Two times a day (BID) | ORAL | 0 refills | Status: DC
Start: 2017-08-23 — End: 2017-09-21

## 2017-08-23 NOTE — Patient Instructions (Addendum)
Sign appropriate paperwork to have records sent to office     Fating labs ordered    Metformin refilled     Follow up in 1 month    Patient Education        Type 2 Diabetes: Care Instructions  Your Care Instructions    Type 2 diabetes is a disease that develops when the body's tissues cannot use insulin properly. Over time, the pancreas cannot make enough insulin. Insulin is a hormone that helps the body's cells use sugar (glucose) for energy. It also helps the body store extra sugar in muscle, fat, and liver cells.  Without insulin, the sugar cannot get into the cells to do its work. It stays in the blood instead. This can cause high blood sugar levels. A person has diabetes when the blood sugar stays too high too much of the time. Over time, diabetes can lead to diseases of the heart, blood vessels, nerves, kidneys, and eyes.  You may be able to control your blood sugar by losing weight, eating a healthy diet, and getting daily exercise. You may also have to take insulin or other diabetes medicine.  Follow-up care is a key part of your treatment and safety. Be sure to make and go to all appointments. Call your doctor if you are having problems. It's also a good idea to know your test results and keep a list of the medicines you take.  How can you care for yourself at home?   Keep your blood sugar at a target level (which you set with your doctor).   Eat a good diet that spreads carbohydrate throughout the day. Carbohydrate-the body's main source of fuel-affects blood sugar more than any other nutrient. Carbohydrate is in fruits, vegetables, milk, and yogurt. It also is in breads, cereals, vegetables such as potatoes and corn, and sugary foods such as candy and cakes.   Aim for 30 minutes of exercise on most, preferably all, days of the week. Walking is a good choice. You also may want to do other activities, such as running, swimming, cycling, or playing tennis or team sports. If your doctor says it's okay, do  muscle-strengthening exercises at least 2 times a week.   Take your medicines exactly as prescribed. Call your doctor if you think you are having a problem with your medicine. You will get more details on the specific medicines your doctor prescribes.   Check your blood sugar as often as your doctor recommends. It is important to keep track of any symptoms you have, such as low blood sugar. Also tell your doctor if you have any changes in your activities, diet, or insulin use.   Talk to your doctor before you start taking aspirin every day. Aspirin can help certain people lower their risk of a heart attack or stroke. But taking aspirin isn't right for everyone, because it can cause serious bleeding.   Do not smoke. If you need help quitting, talk to your doctor about stop-smoking programs and medicines. These can increase your chances of quitting for good.   Keep your cholesterol and blood pressure at normal levels. You may need to take one or more medicines to reach your goals. Take them exactly as directed. Do not stop or change a medicine without talking to your doctor first.  When should you call for help?  Call 911 anytime you think you may need emergency care. For example, call if:    You passed out (lost consciousness), or you suddenly  become very sleepy or confused. (You may have very low blood sugar.)   Call your doctor now or seek immediate medical care if:    Your blood sugar is 300 mg/dL or is higher than the level your doctor has set for you.     You have symptoms of low blood sugar, such as:   Sweating.   Feeling nervous, shaky, and weak.   Extreme hunger and slight nausea.   Dizziness and headache.   Blurred vision.   Confusion.   Watch closely for changes in your health, and be sure to contact your doctor if:    You often have problems controlling your blood sugar.     You have symptoms of long-term diabetes problems, such as:   New vision changes.   New pain, numbness, or  tingling in your hands or feet.   Skin problems.   Where can you learn more?  Go to [email or web address] and sign in to your MyChart account. Enter C553 in the Search Health Information box to learn more about "Type 2 Diabetes: Care Instructions."     If you do not have an account, please click on the "Sign Up Now" link.  Current as of: October 12, 2016  Content Version: 11.7   2006-2018 [employer]. Care instructions adapted under license by [facility]. If you have questions about a medical condition or this instruction, always ask your healthcare professional. [employer] disclaims any warranty or liability for your use of this information.       Patient Education        Learning About Type 2 Diabetes  What is type 2 diabetes?  Insulin is a hormone that helps your body use sugar from your food as energy. Type 2 diabetes happens when your body can't use insulin the right way. Over time, the pancreas can't make enough insulin. If you don't have enough insulin, too much sugar stays in your blood.  If you are overweight, get little or no exercise, or have type 2 diabetes in your family, you are more likely to have problems with the way insulin works in Public relations account executiveyour body. African Americans, Hispanics, Native Americans, Asian Americans, and Pacific Islanders have a higher risk for type 2 diabetes.  Type 2 diabetes can be prevented or delayed with a healthy lifestyle, which includes staying at a healthy weight, making smart food choices, and getting regular exercise.  What can you expect with type 2 diabetes?  You'll keep hearing about how important it is to keep your blood sugar within a target range. That's because over time, high blood sugar can lead to serious problems. It can:   Harm your eyes, nerves, and kidneys.   Damage your blood vessels, leading to heart disease and stroke.   Reduce blood flow and cause nerve damage to parts of your body, especially your feet. This  can cause slow healing and pain when you walk.   Make your immune system weak and less able to fight infections.  When people hear the word "diabetes," they often think of problems like these. But daily care and treatment can help prevent or delay these problems. The goal is to keep your blood sugar in a target range. That's the best way to reduce your chance of having more problems from diabetes.  What are the symptoms?  Some people who have type 2 diabetes may not have any symptoms early on. Many people with the disease don't even know they have it at first. But  with time, diabetes starts to cause symptoms. You experience most symptoms of type 2 diabetes when your blood sugar is either too high or too low.  The most common symptoms of high blood sugar include:   Thirst.   Frequent urination.   Weight loss.   Blurry vision.  The symptoms of low blood sugar include:   Sweating.   Shakiness.   Weakness.   Hunger.   Confusion.  How can you prevent type 2 diabetes?  The best way to prevent or delay type 2 diabetes is to adopt healthy habits, which include:   Staying at a healthy weight.   Exercising regularly.   Eating healthy foods.  How is type 2 diabetes treated?  If you have type 2 diabetes, here are the most important things you can do.   Take your diabetes medicines.   Check your blood sugar as often as your doctor recommends. Also, get a hemoglobin A1c test at least every 6 months.   Try to eat a variety of foods and to spread carbohydrate throughout the day. Carbohydrate raises blood sugar higher and more quickly than any other nutrient does. Carbohydrate is found in sugar, breads and cereals, fruit, starchy vegetables such as potatoes and corn, and milk and yogurt.   Get at least 30 minutes of exercise on most days of the week. Walking is a good choice. You also may want to do other activities, such as running, swimming, cycling, or playing tennis or team sports. If your doctor says it's okay,  do muscle-strengthening exercises at least 2 times a week.   See your doctor for checkups and tests on a regular schedule.   If you have high blood pressure or high cholesterol, take the medicines as prescribed by your doctor.   Do not smoke. Smoking can make health problems worse. This includes problems you might have with type 2 diabetes. If you need help quitting, talk to your doctor about stop-smoking programs and medicines. These can increase your chances of quitting for good.  Follow-up care is a key part of your treatment and safety. Be sure to make and go to all appointments, and call your doctor if you are having problems. It's also a good idea to know your test results and keep a list of the medicines you take.  Where can you learn more?  Go to [email or web address] and sign in to your MyChart account. Enter [phone] in the Search Health Information box to learn more about "Learning About Type 2 Diabetes."     If you do not have an account, please click on the "Sign Up Now" link.  Current as of: October 12, 2016  Content Version: 11.7   2006-2018 [employer]. Care instructions adapted under license by [facility]. If you have questions about a medical condition or this instruction, always ask your healthcare professional. [employer] disclaims any warranty or liability for your use of this information.

## 2017-08-23 NOTE — Progress Notes (Signed)
SUBJECTIVE:    Patient ID:  Wendy Frank is a 36 y.o. female      Patient is here to establish care and for medication refill for type 2 diabetes. She is originally from [place] but moved to the area from [place] approximately 2 months ago. States she has been out of her metformin for about a month. Patient's allergies, medication, medical, surgical, family and social history were reviewed and updated accordingly.  She has had a cholecystectomy and a cesarean section approximately 2-1/2 years ago. States her blood sugars are ranging as high as 200 with an average of 150's, with lows in the 140s.      Diabetes   She presents for her initial diabetic visit. She has type 2 diabetes mellitus. Her disease course has been stable. There are no hypoglycemic associated symptoms. Pertinent negatives for hypoglycemia include no headaches. Associated symptoms include polydipsia (slight). Pertinent negatives for diabetes include no blurred vision, no chest pain, no polyphagia and no polyuria. There are no hypoglycemic complications. Symptoms are stable. There are no diabetic complications. She is following a generally healthy diet.       No current outpatient prescriptions on file prior to visit.     No current facility-administered medications on file prior to visit.       Past Medical History:   Diagnosis Date   . Type 2 diabetes mellitus without complication [facility])      Past Surgical History:   Procedure Laterality Date   . CESAREAN SECTION     . CHOLECYSTECTOMY       Family History   Problem Relation Age of Onset   . Diabetes Mother      Social History     Social History   . Marital status: Married     Spouse name: N/A   . Number of children: N/A   . Years of education: N/A     Occupational History   . Not on file.     Social History Main Topics   . Smoking status: Never Smoker   . Smokeless tobacco: Never Used   . Alcohol use No   . Drug use: Unknown   . Sexual activity: Not on file     Other Topics Concern   .  Not on file     Social History Narrative   . No narrative on file       Review of Systems   Constitutional: Negative for chills and fever.   Eyes: Negative for blurred vision and visual disturbance.   Respiratory: Negative for cough, chest tightness, shortness of breath and wheezing.    Cardiovascular: Negative for chest pain and palpitations.   Gastrointestinal: Negative for abdominal pain, constipation, diarrhea, nausea and vomiting.   Endocrine: Positive for polydipsia (slight). Negative for polyphagia and polyuria.   Genitourinary: Negative for dysuria, frequency and urgency.   Musculoskeletal: Negative for arthralgias and myalgias.   Skin: Negative for rash.   Neurological: Negative for headaches.       OBJECTIVE:    Physical Exam   Constitutional: She is oriented to person, place, and time. She appears well-developed and well-nourished. No distress.   HENT:   Head: Normocephalic and atraumatic.   Right Ear: External ear normal.   Left Ear: External ear normal.   Nose: Nose normal.   Eyes: Pupils are equal, round, and reactive to light. Conjunctivae are normal.   Neck: Normal range of motion. Neck supple. No tracheal deviation present.   Cardiovascular: Normal  rate, regular rhythm and normal heart sounds.    Pulmonary/Chest: Effort normal and breath sounds normal. No respiratory distress. She has no wheezes. She has no rales. She exhibits no tenderness.   Abdominal: Soft. Bowel sounds are normal. She exhibits no distension and no mass. There is no tenderness. There is no rebound and no guarding.   Musculoskeletal: Normal range of motion. She exhibits no edema.   Neurological: She is alert and oriented to person, place, and time.   Skin: Skin is warm and dry. No rash noted.   Psychiatric: She has a normal mood and affect. Her behavior is normal.   Nursing note and vitals reviewed.    BP 118/74   Pulse 94   Temp 98.1 F (36.7 C)   Resp 16   Ht 4\' 11"  (1.499 m)   Wt 151 lb 6.4 oz (68.7 kg)   LMP 07/31/2017    SpO2 98%   BMI 30.58 kg/m    BP Readings from Last 3 Encounters:   08/23/17 118/74      Wt Readings from Last 3 Encounters:   08/23/17 151 lb 6.4 oz (68.7 kg)       ASSESSMENT & PLAN:    1. Type 2 diabetes mellitus without complication, without long-term current use of insulin (HCC)  - Restart metformin at current dose  - metFORMIN (GLUCOPHAGE-XR) 500 MG extended release tablet; Take 1 tablet by mouth 2 times daily  Dispense: 60 tablet; Refill: 0  - CBC Auto Differential; Future  - Comprehensive Metabolic Panel; Future  - Hemoglobin A1C; Future  - POCT Urinalysis no Micro (within normal limits)    2. Screening for cholesterol level  - CBC Auto Differential; Future  - Comprehensive Metabolic Panel; Future  - Lipid Panel; Future    3. Screening for thyroid disorder  - CBC Auto Differential; Future  - Comprehensive Metabolic Panel; Future  - TSH with Reflex; Future    4. Encounter for vitamin deficiency screening  - Vitamin D 25 Hydroxy; Future    5. Need for influenza vaccination  - INFLUENZA, QUADV, 3 YRS AND OLDER, IM, PF, PREFILL SYR OR SDV, 0.5ML (FLUZONE QUADV, PF)    6. Encounter to establish care  - Sided appropriate paperwork to have records sent to the office  - Follow up in one month      Continue current treatment plan.    Current Outpatient Prescriptions   Medication Sig Dispense Refill   . metFORMIN (GLUCOPHAGE-XR) 500 MG extended release tablet Take 1 tablet by mouth 2 times daily 60 tablet 0     No current facility-administered medications for this visit.       Return in about 4 weeks (around 09/20/2017), or if symptoms worsen or fail to improve, for diabetes, screening, establish care.    Wendy Frank received counseling on the following healthy behaviors: nutrition, exercise and medication adherence    Patient given educational materials on Diabetes    I have instructed Wendy Frank to complete a self tracking handout on Blood Sugars and instructed them to bring it with them to her next appointment.      Discussed use, benefit, and side effects of prescribed medications.  Barriers to medication compliance addressed.  All patient questions answered.  Pt voiced understanding.     Call office if experience side effects from medications.      Please note that some or all of this record was generated using voice recognition software. If there are any questions about the content  of this document, please contact the author as some errors in transcription may have occurred.

## 2017-08-24 LAB — CBC WITH AUTO DIFFERENTIAL
Basophils %: 0.6 %
Basophils Absolute: 0 10*3/uL (ref 0.0–0.2)
Eosinophils %: 2 %
Eosinophils Absolute: 0.1 10*3/uL (ref 0.0–0.6)
Hematocrit: 39.7 % (ref 36.0–48.0)
Hemoglobin: 12.3 g/dL (ref 12.0–16.0)
Lymphocytes %: 26.8 %
Lymphocytes Absolute: 1.9 10*3/uL (ref 1.0–5.1)
MCH: 24.8 pg — ABNORMAL LOW (ref 26.0–34.0)
MCHC: 31.1 g/dL (ref 31.0–36.0)
MCV: 79.7 fL — ABNORMAL LOW (ref 80.0–100.0)
MPV: 9.7 fL (ref 5.0–10.5)
Monocytes %: 6.7 %
Monocytes Absolute: 0.5 10*3/uL (ref 0.0–1.3)
Neutrophils %: 63.9 %
Neutrophils Absolute: 4.6 10*3/uL (ref 1.7–7.7)
Platelets: 239 10*3/uL (ref 135–450)
RBC: 4.98 M/uL (ref 4.00–5.20)
RDW: 14 % (ref 12.4–15.4)
WBC: 7.2 10*3/uL (ref 4.0–11.0)

## 2017-08-24 LAB — COMPREHENSIVE METABOLIC PANEL
ALT: 11 U/L (ref 10–40)
AST: 12 U/L — ABNORMAL LOW (ref 15–37)
Albumin/Globulin Ratio: 1.4 (ref 1.1–2.2)
Albumin: 4.5 g/dL (ref 3.4–5.0)
Alkaline Phosphatase: 78 U/L (ref 40–129)
Anion Gap: 17 — ABNORMAL HIGH (ref 3–16)
BUN: 15 mg/dL (ref 7–20)
CO2: 19 mmol/L — ABNORMAL LOW (ref 21–32)
Calcium: 9.3 mg/dL (ref 8.3–10.6)
Chloride: 101 mmol/L (ref 99–110)
Creatinine: <0.5 mg/dL — ABNORMAL LOW (ref 0.6–1.1)
GFR African American: >60 (ref >60)
GFR Non-African American: >60 (ref >60)
Globulin: 3.3 g/dL
Glucose: 147 mg/dL — ABNORMAL HIGH (ref 70–99)
Potassium: 4.9 mmol/L (ref 3.5–5.1)
Sodium: 137 mmol/L (ref 136–145)
Total Bilirubin: 0.3 mg/dL (ref 0.0–1.0)
Total Protein: 7.8 g/dL (ref 6.4–8.2)

## 2017-08-24 LAB — VITAMIN D 25 HYDROXY: Vit D, 25-Hydroxy: 19.1 ng/mL — ABNORMAL LOW (ref >=30)

## 2017-08-24 LAB — LIPID PANEL
Cholesterol, Total: 199 mg/dL (ref 0–199)
HDL: 63 mg/dL — ABNORMAL HIGH (ref 40–60)
LDL Calculated: 122 mg/dL — ABNORMAL HIGH (ref <100)
Triglycerides: 70 mg/dL (ref 0–150)
VLDL Cholesterol Calculated: 14 mg/dL

## 2017-08-24 LAB — HEMOGLOBIN A1C
Hemoglobin A1C: 6.6 %
eAG: 142.7 mg/dL

## 2017-08-24 LAB — TSH WITH REFLEX: TSH: 2.5 u[IU]/mL (ref 0.27–4.20)

## 2017-09-18 ENCOUNTER — Encounter

## 2017-09-21 ENCOUNTER — Encounter

## 2017-09-21 MED ORDER — METFORMIN HCL ER 500 MG PO TB24
500 | ORAL_TABLET | Freq: Two times a day (BID) | ORAL | 0 refills | Status: DC
Start: 2017-09-21 — End: 2017-09-25

## 2017-09-25 ENCOUNTER — Ambulatory Visit: Admit: 2017-09-25 | Discharge: 2017-09-25 | Payer: MEDICAID | Attending: Family Health | Primary: Family Medicine

## 2017-09-25 DIAGNOSIS — E119 Type 2 diabetes mellitus without complications: Secondary | ICD-10-CM

## 2017-09-25 LAB — POCT GLUCOSE: Glucose: 161 mg/dL

## 2017-09-25 MED ORDER — METFORMIN HCL ER 500 MG PO TB24
500 | ORAL_TABLET | Freq: Two times a day (BID) | ORAL | 2 refills | Status: DC
Start: 2017-09-25 — End: 2017-11-19

## 2017-09-25 NOTE — Patient Instructions (Addendum)
Sign appropriate paper work to have labs sent to the office    Labs reviewed    Fasting labs prior to next appointment     Continue Metformin at current dose     Continue vitamin D E 1000-2000 iu's daily    Discussed low cholesterol diet, information given    Watch diet avoid processed, fried, fatty, and fast foods    Follow up in 3 months    Patient Education        High Cholesterol: Care Instructions  Your Care Instructions    Cholesterol is a type of fat in your blood. It is needed for many body functions, such as making new cells. Cholesterol is made by your body. It also comes from food you eat. High cholesterol means that you have too much of the fat in your blood. This raises your risk of a heart attack and stroke.  LDL and HDL are part of your total cholesterol. LDL is the "bad" cholesterol. High LDL can raise your risk for heart disease, heart attack, and stroke. HDL is the "good" cholesterol. It helps clear bad cholesterol from the body. High HDL is linked with a lower risk of heart disease, heart attack, and stroke.  Your cholesterol levels help your doctor find out your risk for having a heart attack or stroke. You and your doctor can talk about whether you need to lower your risk and what treatment is best for you.  A heart-healthy lifestyle along with medicines can help lower your cholesterol and your risk. The way you choose to lower your risk will depend on how high your risk is for heart attack and stroke. It will also depend on how you feel about taking medicines.  Follow-up care is a key part of your treatment and safety. Be sure to make and go to all appointments, and call your doctor if you are having problems. It's also a good idea to know your test results and keep a list of the medicines you take.  How can you care for yourself at home?   Eat a variety of foods every day. Good choices include fruits, vegetables, whole grains (like oatmeal), dried beans and peas, nuts and seeds, soy products  (like tofu), and fat-free or low-fat dairy products.   Replace butter, margarine, and hydrogenated or partially hydrogenated oils with olive and canola oils. (Canola oil margarine without trans fat is fine.)   Replace red meat with fish, poultry, and soy protein (like tofu).   Limit processed and packaged foods like chips, crackers, and cookies.   Bake, broil, or steam foods. Don't fry them.   Be physically active. Get at least 30 minutes of exercise on most days of the week. Walking is a good choice. You also may want to do other activities, such as running, swimming, cycling, or playing tennis or team sports.   Stay at a healthy weight or lose weight by making the changes in eating and physical activity listed above. Losing just a small amount of weight, even 5 to 10 pounds, can reduce your risk for having a heart attack or stroke.   Do not smoke.  When should you call for help?  Watch closely for changes in your health, and be sure to contact your doctor if:    You need help making lifestyle changes.     You have questions about your medicine.   Where can you learn more?  Go to [email or web address] and sign in to your MyChart  account. Enter [id number] in the Search Health Information box to learn more about "High Cholesterol: Care Instructions."     If you do not have an account, please click on the "Sign Up Now" link.  Current as of: October 11, 2016  Content Version: 11.8   2006-2018 [employer]. Care instructions adapted under license by [facility]. If you have questions about a medical condition or this instruction, always ask your healthcare professional. [employer] disclaims any warranty or liability for your use of this information.         Patient Education        Learning About High Cholesterol  What is high cholesterol?    Cholesterol is a type of fat in your blood. It is needed for many body functions, such as making new cells. Cholesterol is made  by your body. It also comes from food you eat.  If you have too much cholesterol, it starts to build up in your arteries. This is called hardening of the arteries, or atherosclerosis. High cholesterol raises your risk of a heart attack and stroke.  There are different types of cholesterol. LDL is the "bad" cholesterol. High LDL can raise your risk for heart disease, heart attack, and stroke. HDL is the "good" cholesterol. High HDL is linked with a lower risk for heart disease, heart attack, and stroke.  Your cholesterol levels help your doctor find out your risk for having a heart attack or stroke.  How can you prevent high cholesterol?  A heart-healthy lifestyle can help you prevent high cholesterol. This lifestyle helps lower your risk for a heart attack and stroke.   Eat heart-healthy foods.  ? Eat fruits, vegetables, whole grains (like oatmeal), dried beans and peas, nuts and seeds, soy products (like tofu), and fat-free or low-fat dairy products.  ? Replace butter, margarine, and hydrogenated or partially hydrogenated oils with olive and canola oils. (Canola oil margarine without trans fat is fine.)  ? Replace red meat with fish, poultry, and soy protein (like tofu).  ? Limit processed and packaged foods like chips, crackers, and cookies.   Be active. Exercise can improve your cholesterol level. Get at least 30 minutes of exercise on most days of the week. Walking is a good choice. You also may want to do other activities, such as running, swimming, cycling, or playing tennis or team sports.   Stay at a healthy weight. Lose weight if you need to.   Don't smoke. If you need help quitting, talk to your doctor about stop-smoking programs and medicines. These can increase your chances of quitting for good.  How is high cholesterol treated?  The goal of treatment is to reduce your chances of having a heart attack or stroke. The goal is not to lower your cholesterol numbers only.   You may make lifestyle  changes, such as eating healthy foods, not smoking, losing weight, and being more active.   You may have to take medicine.  Follow-up care is a key part of your treatment and safety. Be sure to make and go to all appointments, and call your doctor if you are having problems. It's also a good idea to know your test results and keep a list of the medicines you take.  Where can you learn more?  Go to [email or web address] and sign in to your MyChart account. Enter [phone] in the Search Health Information box to learn more about "Learning About High Cholesterol."     If you do not have  an account, please click on the "Sign Up Now" link.  Current as of: October 11, 2016  Content Version: 11.8   2006-2018 [employer]. Care instructions adapted under license by [facility]. If you have questions about a medical condition or this instruction, always ask your healthcare professional. [employer] disclaims any warranty or liability for your use of this information.         Patient Education        Learning About the Glycemic Index  What is the glycemic index?  The glycemic index (GI) is a rating system for foods that contain carbohydrate. It helps you know how quickly these foods raise blood sugar.  Carbohydrate raises blood sugar more quickly than other nutrients, like proteins and fats. Some carbohydrate foods raise blood sugar faster than others.   Low glycemic foods release sugar into the blood slowly.   High glycemic foods make blood sugar rise quickly.  How does it work?  Foods in the index are ranked by number.   High glycemic index foods are rated 70 and above.   Medium glycemic index foods are 56 to 69.   Low glycemic index foods are 55 or less.  What do you need to know?  Some people who have diabetes use the glycemic index to help them plan meals and manage blood sugar.   If you have diabetes, talk with your doctor, a dietitian, or a certified diabetes educator  before using a low glycemic index eating plan.   Eating low glycemic foods is most helpful when used along with another eating plan for diabetes, such as carbohydrate counting. Counting carbs helps you know how much carbohydrate you're eating. The amount of carbohydrate you eat is more important than the glycemic index of foods in helping you control your blood sugar.   The rating of a food can change depending on ripeness, how it is prepared (juiced, mashed, ground), how it is cooked, and how long it is stored.   People respond differently to the glycemic content of foods. Many things affect the glycemic index. The only way to know for sure how a food affects your blood sugar is to check your blood sugar before and after you eat that food.   High GI foods are rarely eaten on their own. This means that the glycemic index might not be helpful unless you're eating a food by itself. Eating foods together can change their rating.  What are examples of foods in the glycemic index?   High glycemic index foods include:  ? Watermelon.  ? Baked potatoes, such as a russet.  ? Pumpkin.  ? Instant oatmeal.  ? White bread.   Medium glycemic index foods include:  ? Hamburger buns (white).  ? Brown rice.   Low glycemic index foods include:  ? Apples.  ? Sweet potatoes.  ? Whole-grain bread.  ? Whole wheat spaghetti.  ? Dried beans and lentils.  Where can you learn more?  Go to [email or web address] and sign in to your MyChart account. Enter [id number] in the Search Health Information box to learn more about "Learning About the Glycemic Index."     If you do not have an account, please click on the "Sign Up Now" link.  Current as of: October 12, 2016  Content Version: 11.8   2006-2018 [employer]. Care instructions adapted under license by [facility]. If you have questions about a medical condition or this instruction, always ask your healthcare professional. [employer] disclaims any  warranty or liability for your use of this information.         Patient Education        Type 2 Diabetes: Care Instructions  Your Care Instructions    Type 2 diabetes is a disease that develops when the body's tissues cannot use insulin properly. Over time, the pancreas cannot make enough insulin. Insulin is a hormone that helps the body's cells use sugar (glucose) for energy. It also helps the body store extra sugar in muscle, fat, and liver cells.  Without insulin, the sugar cannot get into the cells to do its work. It stays in the blood instead. This can cause high blood sugar levels. A person has diabetes when the blood sugar stays too high too much of the time. Over time, diabetes can lead to diseases of the heart, blood vessels, nerves, kidneys, and eyes.  You may be able to control your blood sugar by losing weight, eating a healthy diet, and getting daily exercise. You may also have to take insulin or other diabetes medicine.  Follow-up care is a key part of your treatment and safety. Be sure to make and go to all appointments. Call your doctor if you are having problems. It's also a good idea to know your test results and keep a list of the medicines you take.  How can you care for yourself at home?   Keep your blood sugar at a target level (which you set with your doctor).  ? Eat a good diet that spreads carbohydrate throughout the day. Carbohydrate--the body's main source of fuel--affects blood sugar more than any other nutrient. Carbohydrate is in fruits, vegetables, milk, and yogurt. It also is in breads, cereals, vegetables such as potatoes and corn, and sugary foods such as candy and cakes.  ? Aim for 30 minutes of exercise on most, preferably all, days of the week. Walking is a good choice. You also may want to do other activities, such as running, swimming, cycling, or playing tennis or team sports. If your doctor says it's okay, do muscle-strengthening exercises at least 2 times a week.  ? Take  your medicines exactly as prescribed. Call your doctor if you think you are having a problem with your medicine. You will get more details on the specific medicines your doctor prescribes.   Check your blood sugar as often as your doctor recommends. It is important to keep track of any symptoms you have, such as low blood sugar. Also tell your doctor if you have any changes in your activities, diet, or insulin use.   Talk to your doctor before you start taking aspirin every day. Aspirin can help certain people lower their risk of a heart attack or stroke. But taking aspirin isn't right for everyone, because it can cause serious bleeding.   Do not smoke. If you need help quitting, talk to your doctor about stop-smoking programs and medicines. These can increase your chances of quitting for good.   Keep your cholesterol and blood pressure at normal levels. You may need to take one or more medicines to reach your goals. Take them exactly as directed. Do not stop or change a medicine without talking to your doctor first.  When should you call for help?  Call 911 anytime you think you may need emergency care. For example, call if:    You passed out (lost consciousness), or you suddenly become very sleepy or confused. (You may have very low blood sugar.)   Call your  doctor now or seek immediate medical care if:    Your blood sugar is 300 mg/dL or is higher than the level your doctor has set for you.     You have symptoms of low blood sugar, such as:  ? Sweating.  ? Feeling nervous, shaky, and weak.  ? Extreme hunger and slight nausea.  ? Dizziness and headache.  ? Blurred vision.  ? Confusion.   Watch closely for changes in your health, and be sure to contact your doctor if:    You often have problems controlling your blood sugar.     You have symptoms of long-term diabetes problems, such as:  ? New vision changes.  ? New pain, numbness, or tingling in your hands or feet.  ? Skin problems.   Where can you  learn more?  Go to [email or web address] and sign in to your MyChart account. Enter C553 in the Search Health Information box to learn more about "Type 2 Diabetes: Care Instructions."     If you do not have an account, please click on the "Sign Up Now" link.  Current as of: October 12, 2016  Content Version: 11.8   2006-2018 [employer]. Care instructions adapted under license by [facility]. If you have questions about a medical condition or this instruction, always ask your healthcare professional. [employer] disclaims any warranty or liability for your use of this information.

## 2017-09-25 NOTE — Progress Notes (Signed)
SUBJECTIVE:    Patient ID:  Wendy Frank is a 36 y.o. female      Patient is here for a medication check for type 2 diabetes, hyperlipidemia and vitamin D deficiency. She is doing well on current regimen and has no further concerns. Her blood sugars are 70-80's before breakfast and 150's-160's. Labs reviewed from 08/23/17, A1C 6.1, vitamin D 19.1, TSH 2.50, total cholesterol 199, triglycerides 70, HDL 63, LDL 122, CMP was essentially normal except for glucose of 147, CBC was essentially normal. Encourage lifestyle modifications (better food choices, portion control and increasing activity).       Diabetes   She presents for her follow-up diabetic visit. She has type 2 diabetes mellitus. Her disease course has been stable. Pertinent negatives for hypoglycemia include no headaches. Pertinent negatives for diabetes include no blurred vision, no chest pain, no fatigue, no foot ulcerations, no polydipsia, no polyphagia, no polyuria and no weight loss. Symptoms are stable. Risk factors for coronary artery disease include obesity, dyslipidemia and diabetes mellitus. Her weight is stable. She is following a generally healthy diet. There is no change in her home blood glucose trend.       No current outpatient prescriptions on file prior to visit.     No current facility-administered medications on file prior to visit.       Past Medical History:   Diagnosis Date   . Type 2 diabetes mellitus without complication [facility](HCC)      Past Surgical History:   Procedure Laterality Date   . CESAREAN SECTION     . CHOLECYSTECTOMY       Family History   Problem Relation Age of Onset   . Diabetes Mother      Social History     Social History   . Marital status: Married     Spouse name: N/A   . Number of children: N/A   . Years of education: N/A     Occupational History   . Not on file.     Social History Main Topics   . Smoking status: Never Smoker   . Smokeless tobacco: Never Used   . Alcohol use No   . Drug use: Unknown   . Sexual  activity: Not on file     Other Topics Concern   . Not on file     Social History Narrative   . No narrative on file       Review of Systems   Constitutional: Negative for chills, fatigue, fever and weight loss.   Eyes: Negative for blurred vision and visual disturbance.   Respiratory: Negative for cough, chest tightness, shortness of breath and wheezing.    Cardiovascular: Negative for chest pain and palpitations.   Gastrointestinal: Negative for abdominal pain, constipation, diarrhea, nausea and vomiting.   Endocrine: Negative for polydipsia, polyphagia and polyuria.   Genitourinary: Negative for dysuria, frequency and urgency.   Musculoskeletal: Negative for arthralgias and myalgias.   Skin: Negative for rash.   Neurological: Negative for headaches.   Psychiatric/Behavioral: Negative for sleep disturbance.       OBJECTIVE:    Physical Exam   Constitutional: She is oriented to person, place, and time. She appears well-developed and well-nourished. No distress.   HENT:   Head: Normocephalic and atraumatic.   Right Ear: External ear normal.   Left Ear: External ear normal.   Nose: Nose normal.   Eyes: Pupils are equal, round, and reactive to light. Conjunctivae are normal.   Neck: Normal  range of motion. Neck supple. No JVD present. Carotid bruit is not present. No tracheal deviation present.   Cardiovascular: Normal rate, regular rhythm and normal heart sounds.    Pulmonary/Chest: Effort normal and breath sounds normal. No respiratory distress. She has no wheezes. She has no rales. She exhibits no tenderness.   Abdominal: Soft. Bowel sounds are normal. She exhibits no distension. There is no tenderness.   Musculoskeletal: Normal range of motion. She exhibits no edema.   Neurological: She is alert and oriented to person, place, and time.   Skin: Skin is warm and dry. No rash noted.   Psychiatric: She has a normal mood and affect. Her behavior is normal.   Nursing note and vitals reviewed.    BP 122/78   Pulse 120    Temp 98 F (36.7 C)   Resp 16   Ht 4\' 11"  (1.499 m)   Wt 152 lb 9.6 oz (69.2 kg)   LMP 08/29/2017   SpO2 98%   BMI 30.82 kg/m    BP Readings from Last 3 Encounters:   09/25/17 122/78   08/23/17 118/74      Wt Readings from Last 3 Encounters:   09/25/17 152 lb 9.6 oz (69.2 kg)   08/23/17 151 lb 6.4 oz (68.7 kg)       ASSESSMENT & PLAN:    1. Type 2 diabetes mellitus without complication, without long-term current use of insulin (HCC)  - POCT Glucose (161)  - metFORMIN (GLUCOPHAGE-XR) 500 MG extended release tablet; Take 1 tablet by mouth 2 times daily  Dispense: 30 tablet; Refill: 2  - CBC Auto Differential; Future  - Comprehensive Metabolic Panel; Future  - Hemoglobin A1C; Future    2. Elevated cholesterol  - CBC Auto Differential; Future  - Comprehensive Metabolic Panel; Future  - Lipid Panel; Future  - Discussed low cholesterol diet, information given  - Watch diet avoid processed, fried, fatty, and fast foods    3. Vitamin D deficiency  - Continue vitamin D E 1000-2000 iu's daily      Continue current treatment plan.    Current Outpatient Prescriptions   Medication Sig Dispense Refill   . metFORMIN (GLUCOPHAGE-XR) 500 MG extended release tablet Take 1 tablet by mouth 2 times daily 30 tablet 2     No current facility-administered medications for this visit.       Return in about 3 months (around 12/26/2017), or if symptoms worsen or fail to improve, for diabete, elevated cholesterol, vit D .    Wendy Frank received counseling on the following healthy behaviors: nutrition, exercise and medication adherence    Discussed use, benefit, and side effects of prescribed medications.  Barriers to medication compliance addressed.  All patient questions answered.  Pt voiced understanding.     Call office if experience side effects from medications.      Please note that some or all of this record was generated using voice recognition software. If there are any questions about the content of this document, please  contact the author as some errors in transcription may have occurred.

## 2017-11-11 ENCOUNTER — Encounter

## 2017-11-15 ENCOUNTER — Encounter

## 2017-11-18 NOTE — Progress Notes (Signed)
SUBJECTIVE:    Patient ID:  Wendy Frank is a 37 y.o. female      Patient is here for a medication check for type 2 diabetes, hyperlipidemia and vitamin D deficiency. She is doing well on current regimen and has no further concerns. Her blood sugars are 90-100's before breakfast and 140's-150's. Labs reviewed from 08/23/17, A1C 6.1, vitamin D 19.1, TSH 2.50, total cholesterol 199, triglycerides 70, HDL 63, LDL 122, CMP was essentially normal except for glucose of 147, CBC was essentially normal. Encourage continued lifestyle modifications (better food choices, portion control and increasing activity).       Hyperlipidemia   This is a new problem. The current episode started more than 1 month ago. Exacerbating diseases include diabetes and obesity. She has no history of chronic renal disease, hypothyroidism, liver disease or nephrotic syndrome. Pertinent negatives include no chest pain, focal sensory loss, focal weakness, leg pain, myalgias or shortness of breath. Current antihyperlipidemic treatment includes diet change and exercise. Risk factors for coronary artery disease include diabetes mellitus, dyslipidemia and obesity.   Diabetes   She presents for her follow-up diabetic visit. She has type 2 diabetes mellitus. Her disease course has been stable. There are no hypoglycemic associated symptoms. Pertinent negatives for hypoglycemia include no headaches. Pertinent negatives for diabetes include no chest pain, no fatigue, no foot paresthesias, no foot ulcerations, no polydipsia, no polyphagia, no polyuria and no visual change. Symptoms are stable. There are no diabetic complications. Risk factors for coronary artery disease include diabetes mellitus and obesity. Current diabetic treatment includes oral agent (monotherapy). Her weight is stable. She is following a generally healthy diet.       No current outpatient prescriptions on file prior to visit.     No current facility-administered medications on file  prior to visit.       Past Medical History:   Diagnosis Date   . Type 2 diabetes mellitus without complication [facility])      Past Surgical History:   Procedure Laterality Date   . CESAREAN SECTION     . CHOLECYSTECTOMY       Family History   Problem Relation Age of Onset   . Diabetes Mother      Social History     Social History   . Marital status: Married     Spouse name: N/A   . Number of children: N/A   . Years of education: N/A     Occupational History   . Not on file.     Social History Main Topics   . Smoking status: Never Smoker   . Smokeless tobacco: Never Used   . Alcohol use No   . Drug use: Unknown   . Sexual activity: Not on file     Other Topics Concern   . Not on file     Social History Narrative   . No narrative on file       Review of Systems   Constitutional: Negative for chills, fatigue and fever.   Eyes: Negative for visual disturbance.   Respiratory: Negative for cough, chest tightness, shortness of breath and wheezing.    Cardiovascular: Negative for chest pain and palpitations.   Gastrointestinal: Negative for abdominal pain, constipation, diarrhea, nausea and vomiting.   Endocrine: Negative for polydipsia, polyphagia and polyuria.   Genitourinary: Negative for dysuria, frequency and urgency.   Musculoskeletal: Negative for arthralgias and myalgias.   Skin: Negative for rash.   Neurological: Negative for focal weakness and headaches.  OBJECTIVE:    Physical Exam   Constitutional: She is oriented to person, place, and time. She appears well-developed and well-nourished. No distress.   HENT:   Head: Normocephalic and atraumatic.   Right Ear: External ear normal.   Left Ear: External ear normal.   Nose: Nose normal.   Eyes: Pupils are equal, round, and reactive to light. Conjunctivae are normal.   Neck: Normal range of motion. Neck supple. No JVD present. Carotid bruit is not present. No tracheal deviation present.   Cardiovascular: Normal rate, regular rhythm and normal heart sounds.     Pulmonary/Chest: Effort normal and breath sounds normal. No respiratory distress. She has no wheezes. She has no rales. She exhibits no tenderness.   Abdominal: Soft. Bowel sounds are normal. She exhibits no distension and no mass. There is no tenderness. There is no rebound and no guarding.   Musculoskeletal: Normal range of motion. She exhibits no edema.   Neurological: She is alert and oriented to person, place, and time.   Skin: Skin is warm and dry. No rash noted.   Psychiatric: She has a normal mood and affect. Her behavior is normal.   Nursing note and vitals reviewed.    BP 122/76   Pulse 98   Resp 19   Wt 156 lb 9.6 oz (71 kg)   LMP 10/30/2017   SpO2 98%   BMI 31.63 kg/m    BP Readings from Last 3 Encounters:   11/19/17 122/76   09/25/17 122/78   08/23/17 118/74      Wt Readings from Last 3 Encounters:   11/19/17 156 lb 9.6 oz (71 kg)   09/25/17 152 lb 9.6 oz (69.2 kg)   08/23/17 151 lb 6.4 oz (68.7 kg)       ASSESSMENT & PLAN:    1. Type 2 diabetes mellitus without complication, without long-term current use of insulin (HCC)  - Stable, continue current regimen  - CBC Auto Differential; Future  - Comprehensive Metabolic Panel; Future  - Hemoglobin A1C; Future  - metFORMIN (GLUCOPHAGE-XR) 500 MG extended release tablet; Take 1 tablet by mouth 2 times daily  Dispense: 60 tablet; Refill: 2  - POCT Glucose (150)    2. Elevated cholesterol  - Stable, continue lifestyle modifications   - CBC Auto Differential; Future  - Comprehensive Metabolic Panel; Future  - Lipid Panel; Future    3. Vitamin D deficiency  - Stable, continue vitamin D 3 supplementation      Continue current treatment plan.    Current Outpatient Prescriptions   Medication Sig Dispense Refill   . metFORMIN (GLUCOPHAGE-XR) 500 MG extended release tablet Take 1 tablet by mouth 2 times daily 60 tablet 2     No current facility-administered medications for this visit.       Return in about 3 months (around 02/17/2018), or if symptoms worsen or  fail to improve, for lipidemia, diabetes, vit D.    Wendy Frank received counseling on the following healthy behaviors: nutrition, exercise and medication adherence    Patient given educational materials on Diabetes, Hyperlipidemia and Nutrition    Discussed use, benefit, and side effects of prescribed medications.  Barriers to medication compliance addressed.  All patient questions answered.  Pt voiced understanding.     Call office if experience side effects from medications.      Please note that some or all of this record was generated using voice recognition software. If there are any questions about the content of this document,  please contact the [facility] as some errors in transcription may have occurred.

## 2017-11-19 ENCOUNTER — Ambulatory Visit: Admit: 2017-11-19 | Discharge: 2017-11-19 | Payer: MEDICAID | Attending: Family Health | Primary: Family Medicine

## 2017-11-19 DIAGNOSIS — E119 Type 2 diabetes mellitus without complications: Secondary | ICD-10-CM

## 2017-11-19 LAB — POCT GLUCOSE: Glucose: 150 mg/dL

## 2017-11-19 MED ORDER — METFORMIN HCL ER 500 MG PO TB24
500 | ORAL_TABLET | Freq: Two times a day (BID) | ORAL | 2 refills | Status: DC
Start: 2017-11-19 — End: 2018-02-08

## 2017-11-19 NOTE — Patient Instructions (Signed)
Fasting labs ordered    Medication refilled    Discussed low cholesterol diet avoid process, fried, fatty and fast foods    Low cholesterol diet information given    Encouraged vitamin D 3 1000-2000 iu's daily    Discussed diet hight in vitamin reich foods, information given    Encourage continued lifestyle modifications (better food choices, portion control and increasing activity)

## 2017-11-19 NOTE — Progress Notes (Signed)
BP Readings from Last 2 Encounters:   11/19/17 122/76   09/25/17 122/78     Hemoglobin A1C (%)   Date Value   08/23/2017 6.6     LDL Calculated (mg/dL)   Date Value   16/10/960410/18/2018 122 (H)              Tobacco use:  Patient  reports that she has never smoked. She has never used smokeless tobacco.    If Smoker - Cessation materials given? NA    Is Daily aspirin on medication list?:  No    Diabetic retinal exam done? No   If yes, document in Health Maintenance.     Monofilament placed on counter? No    Shoes and socks removed? No    BP taken with correct size cuff? Yes   Repeated if > 140/90 NA     Is patient taking any medications for diabetes    Yes   If yes, see medication list    Is the patient reporting any side effects of diabetic medications?   No    Microalbumin performed if applicable?  NA      Is patient taking any over the counter medications    No   If yes, see medication list        Patient Self-Management Goal for Chronic Condition  Goal: I will schedule the recommended follow up visit when leaving the office today, and agree to keep the appointment or to reschedule when I call to cancel.  Barriers to success: none  Plan for overcoming my barriers: N/A     Confidence: 9/10  Date goal set: 11/19/17  Date goal attained:

## 2018-01-21 ENCOUNTER — Encounter

## 2018-01-28 ENCOUNTER — Encounter: Attending: Family Health | Primary: Family Medicine

## 2018-01-28 NOTE — Progress Notes (Deleted)
SUBJECTIVE:    Patient ID:  Wendy Frank is a 37 y.o. female      Patient is here for a medication check for type 2 diabetes, hyperlipidemia and vitamin D deficiency. She is doing well on current regimen and has no further concerns. Her blood sugars are 90-100's before breakfast and 140's-150's. Labs reviewed from 08/23/17, A1C 6.1, vitamin D 19.1, TSH 2.50, total cholesterol 199, triglycerides 70, HDL 63, LDL 122, CMP was essentially normal except for glucose of 147, CBC was essentially normal. Encourage continued lifestyle modifications (better food choices, portion control and increasing activity).     Hyperlipidemia   Pertinent negatives include no chest pain, myalgias or shortness of breath.   Diabetes   Pertinent negatives for hypoglycemia include no headaches. Pertinent negatives for diabetes include no chest pain.       Current Outpatient Medications on File Prior to Visit   Medication Sig Dispense Refill   ??? metFORMIN (GLUCOPHAGE-XR) 500 MG extended release tablet Take 1 tablet by mouth 2 times daily 60 tablet 2     No current facility-administered medications on file prior to visit.       Past Medical History:   Diagnosis Date   ??? Type 2 diabetes mellitus without complication (HCC)      Past Surgical History:   Procedure Laterality Date   ??? CESAREAN SECTION     ??? CHOLECYSTECTOMY       Family History   Problem Relation Age of Onset   ??? Diabetes Mother      Social History     Socioeconomic History   ??? Marital status: Married     Spouse name: Not on file   ??? Number of children: Not on file   ??? Years of education: Not on file   ??? Highest education level: Not on file   Occupational History   ??? Not on file   Social Needs   ??? Financial resource strain: Not on file   ??? Food insecurity:     Worry: Not on file     Inability: Not on file   ??? Transportation needs:     Medical: Not on file     Non-medical: Not on file   Tobacco Use   ??? Smoking status: Never Smoker   ??? Smokeless tobacco: Never Used   Substance and  Sexual Activity   ??? Alcohol use: No   ??? Drug use: Not on file   ??? Sexual activity: Not on file   Lifestyle   ??? Physical activity:     Days per week: Not on file     Minutes per session: Not on file   ??? Stress: Not on file   Relationships   ??? Social connections:     Talks on phone: Not on file     Gets together: Not on file     Attends religious service: Not on file     Active member of club or organization: Not on file     Attends meetings of clubs or organizations: Not on file     Relationship status: Not on file   ??? Intimate partner violence:     Fear of current or ex partner: Not on file     Emotionally abused: Not on file     Physically abused: Not on file     Forced sexual activity: Not on file   Other Topics Concern   ??? Not on file   Social History Narrative   ???  Not on file       Review of Systems   Constitutional: Negative for chills and fever.   Eyes: Negative for visual disturbance.   Respiratory: Negative for cough, chest tightness, shortness of breath and wheezing.    Cardiovascular: Negative for chest pain and palpitations.   Gastrointestinal: Negative for abdominal pain, constipation, diarrhea, nausea and vomiting.   Musculoskeletal: Negative for arthralgias and myalgias.   Skin: Negative for rash.   Neurological: Negative for headaches.       OBJECTIVE:    Physical Exam   Constitutional: She is oriented to person, place, and time. She appears well-developed and well-nourished. No distress.   HENT:   Head: Normocephalic and atraumatic.   Right Ear: External ear normal.   Left Ear: External ear normal.   Nose: Nose normal.   Eyes: Pupils are equal, round, and reactive to light. Conjunctivae are normal.   Neck: Normal range of motion. Neck supple. No tracheal deviation present.   Cardiovascular: Normal rate, regular rhythm and normal heart sounds.   Pulmonary/Chest: Effort normal and breath sounds normal. No respiratory distress. She has no wheezes. She has no rales. She exhibits no tenderness.    Abdominal: Soft. Bowel sounds are normal. She exhibits no distension. There is no tenderness.   Musculoskeletal: Normal range of motion. She exhibits no edema.   Neurological: She is alert and oriented to person, place, and time.   Skin: Skin is warm and dry. No rash noted.   Psychiatric: She has a normal mood and affect. Her behavior is normal.   Nursing note and vitals reviewed.    There were no vitals taken for this visit.   BP Readings from Last 3 Encounters:   11/19/17 122/76   09/25/17 122/78   08/23/17 118/74      Wt Readings from Last 3 Encounters:   11/19/17 156 lb 9.6 oz (71 kg)   09/25/17 152 lb 9.6 oz (69.2 kg)   08/23/17 151 lb 6.4 oz (68.7 kg)       ASSESSMENT & PLAN:    There are no diagnoses linked to this encounter.    Continue current treatment plan.    Current Outpatient Medications   Medication Sig Dispense Refill   ??? metFORMIN (GLUCOPHAGE-XR) 500 MG extended release tablet Take 1 tablet by mouth 2 times daily 60 tablet 2     No current facility-administered medications for this visit.       No follow-ups on file.    Call office if experience side effects from medications.      Please note that some or all of this record was generated using voice recognition software. If there are any questions about the content of this document, please contact the author as some errors in transcription may have occurred.

## 2018-02-08 ENCOUNTER — Ambulatory Visit: Admit: 2018-02-08 | Discharge: 2018-02-08 | Payer: MEDICAID | Attending: Registered Nurse | Primary: Family Medicine

## 2018-02-08 DIAGNOSIS — Z7689 Persons encountering health services in other specified circumstances: Secondary | ICD-10-CM

## 2018-02-08 LAB — POCT MICROALBUMIN
Creatinine Ur POCT: 300
Microalb, Ur: 30
Microalb/Creat Ratio: <30

## 2018-02-08 LAB — POCT GLYCOSYLATED HEMOGLOBIN (HGB A1C): Hemoglobin A1C: 6.5 %

## 2018-02-08 MED ORDER — METFORMIN HCL ER 500 MG PO TB24
500 MG | ORAL_TABLET | Freq: Two times a day (BID) | ORAL | 5 refills | Status: DC
Start: 2018-02-08 — End: 2018-08-08

## 2018-02-08 MED ORDER — TETANUS-DIPHTH-ACELL PERTUSSIS 5-2-15.5 LF-MCG/0.5 IM SUSP
Freq: Once | INTRAMUSCULAR | 0 refills | Status: AC
Start: 2018-02-08 — End: 2018-02-08

## 2018-02-08 NOTE — Patient Instructions (Signed)
Patient Education        Diabetes Foot Health: Care Instructions  Your Care Instructions    When you have diabetes, your feet need extra care and attention. Diabetes can damage the nerve endings and blood vessels in your feet, making you less likely to notice when your feet are injured. Diabetes also limits your body's ability to fight infection and get blood to areas that need it. If you get a minor foot injury, it could become an ulcer or a serious infection. With good foot care, you can prevent most of these problems.  Caring for your feet can be quick and easy. Most of the care can be done when you are bathing or getting ready for bed.  Follow-up care is a key part of your treatment and safety. Be sure to make and go to all appointments, and call your doctor if you are having problems. It's also a good idea to know your test results and keep a list of the medicines you take.  How can you care for yourself at home?  ?? Keep your blood sugar close to normal by watching what and how much you eat, monitoring blood sugar, taking medicines if prescribed, and getting regular exercise.  ?? Do not smoke. Smoking affects blood flow and can make foot problems worse. If you need help quitting, talk to your doctor about stop-smoking programs and medicines. These can increase your chances of quitting for good.  ?? Eat a diet that is low in fats. High fat intake can cause fat to build up in your blood vessels and decrease blood flow.  ?? Inspect your feet daily for blisters, cuts, cracks, or sores. If you cannot see well, use a mirror or have someone help you.  ?? Take care of your feet:  ? Wash your feet every day. Use warm (not hot) water. Check the water temperature with your wrists or other part of your body, not your feet.  ? Dry your feet well. Pat them dry. Do not rub the skin on your feet too hard. Dry well between your toes. If the skin on your feet stays moist, bacteria or a fungus can grow, which can lead to  infection.  ? Keep your skin soft. Use moisturizing skin cream to keep the skin on your feet soft and prevent calluses and cracks. But do not put the cream between your toes, and stop using any cream that causes a rash.  ? Clean underneath your toenails carefully. Do not use a sharp object to clean underneath your toenails. Use the blunt end of a nail file or other rounded tool.  ? Trim and file your toenails straight across to prevent ingrown toenails. Use a nail clipper, not scissors. Use an emery board to smooth the edges.  ?? Change socks daily. Socks without seams are best, because seams often rub the feet. You can find socks for people with diabetes from specialty catalogs.  ?? Look inside your shoes every day for things like gravel or torn linings, which could cause blisters or sores.  ?? Buy shoes that fit well:  ? Look for shoes that have plenty of space around the toes. This helps prevent bunions and blisters.  ? Try on shoes while wearing the kind of socks you will usually wear with the shoes.  ? Avoid plastic shoes. They may rub your feet and cause blisters. Good shoes should be made of materials that are flexible and breathable, such as leather or cloth.  ?   Break in new shoes slowly by wearing them for no more than an hour a day for several days. Take extra time to check your feet for red areas, blisters, or other problems after you wear new shoes.  ?? Do not go barefoot. Do not wear sandals, and do not wear shoes with very thin soles. Thin soles are easy to puncture. They also do not protect your feet from hot pavement or cold weather.  ?? Have your doctor check your feet during each visit. If you have a foot problem, see your doctor. Do not try to treat an early foot problem at home. Home remedies or treatments that you can buy without a prescription (such as corn removers) can be harmful.  ?? Always get early treatment for foot problems. A minor irritation can lead to a major problem if not properly cared  for early.  When should you call for help?  Call your doctor now or seek immediate medical care if:  ?? ?? You have a foot sore, an ulcer or break in the skin that is not healing after 4 days, bleeding corns or calluses, or an ingrown toenail.   ?? ?? You have blue or black areas, which can mean bruising or blood flow problems.   ?? ?? You have peeling skin or tiny blisters between your toes or cracking or oozing of the skin.   ?? ?? You have a fever for more than 24 hours and a foot sore.   ?? ?? You have new numbness or tingling in your feet that does not go away after you move your feet or change positions.   ?? ?? You have unexplained or unusual swelling of the foot or ankle.   ??Watch closely for changes in your health, and be sure to contact your doctor if:  ?? ?? You cannot do proper foot care.   Where can you learn more?  Go to [email or web address] and sign in to your MyChart account. Enter A739 in the Search Health Information box to learn more about "Diabetes Foot Health: Care Instructions."     If you do not have an account, please click on the "Sign Up Now" link.  Current as of: May 30, 2017  Content Version: 11.9  ?? 2006-2018 [employer]. Care instructions adapted under license by [facility]. If you have questions about a medical condition or this instruction, always ask your healthcare professional. [employer] disclaims any warranty or liability for your use of this information.

## 2018-02-08 NOTE — Progress Notes (Signed)
[facility]  Clinic Note    Date: 02/08/2018                                               Subjective/Objective:     Chief Complaint   Patient presents with   ??? New Patient     NEW PATIENT DM       HPI Patient presents to establish care and for DM follow up.  Patient is originally from [place] and moved to [place].  She lived in [place] previously and moved to [place] in 2018.  Speaks English.    Diabetes Mellitus Type II:  Home blood sugar records: patient does not test.  No significant episodes of hypoglycemia.  No polyuria, polydipsia, visual changes, foot problems, GI upset.  Diabetic diet compliance:  compliant most of the time,  Weight trend: stable.  Current exercise: walking.  Eye exam current (within one year): no.  She is due for her diabetic foot exam today and microalbumin.  Fasting labs and renal function were completed last fall.    She is overdue for several health maintenance items.    Lab Results   Component Value Date    LABA1C 6.6 08/23/2017     Lab Results   Component Value Date    CREATININE <0.5 (L) 08/23/2017     Lab Results   Component Value Date    ALT 11 08/23/2017    AST 12 (L) 08/23/2017     No components found for: CHLPL  Lab Results   Component Value Date    TRIG 70 08/23/2017     Lab Results   Component Value Date    HDL 63 (H) 08/23/2017     Lab Results   Component Value Date    LDLCALC 122 (H) 08/23/2017        There are no active problems to display for this patient.      Past Medical History:   Diagnosis Date   ??? Type 2 diabetes mellitus without complication (HCC)        Past Surgical History:   Procedure Laterality Date   ??? CESAREAN SECTION     ??? CHOLECYSTECTOMY         Office Visit on 11/19/2017   Component Date Value Ref Range Status   ??? Glucose 11/19/2017 150  mg/dL Final       Family History   Problem Relation Age of Onset   ??? Diabetes Mother        Current Outpatient Medications   Medication Sig Dispense Refill   ??? metFORMIN (GLUCOPHAGE-XR) 500  MG extended release tablet Take 1 tablet by mouth 2 times daily 60 tablet 2     No current facility-administered medications for this visit.        No Known Allergies    Review of Systems   Constitutional: Negative for chills, diaphoresis, fatigue and fever.   Respiratory: Negative for cough, chest tightness, shortness of breath and wheezing.    Cardiovascular: Negative for chest pain, palpitations and leg swelling.   Gastrointestinal: Negative for abdominal pain, constipation, diarrhea, nausea and vomiting.   Endocrine: Negative for polydipsia, polyphagia and polyuria.   Skin: Negative for wound.   Neurological: Negative for dizziness, weakness, light-headedness, numbness and headaches.   All other systems reviewed and are negative.  Vitals:  BP 112/84 (Site: Right Upper Arm, Position: Sitting, Cuff Size: Medium Adult)    Pulse 84    Temp 98.4 ??F (36.9 ??C) (Oral)    Resp 16    Ht 4' 11.25" (1.505 m) Comment: NO SHOES   Wt 154 lb (69.9 kg) Comment: NO SHOES   LMP 02/02/2018    SpO2 98%    BMI 30.84 kg/m??     Physical Exam   Constitutional: She is oriented to person, place, and time. She appears well-developed and well-nourished.   HENT:   Head: Normocephalic and atraumatic.   Right Ear: Tympanic membrane, external ear and ear canal normal.   Left Ear: Tympanic membrane, external ear and ear canal normal.   Nose: Nose normal.   Mouth/Throat: Uvula is midline, oropharynx is clear and moist and mucous membranes are normal. No tonsillar exudate.   Eyes: Pupils are equal, round, and reactive to light. Conjunctivae and EOM are normal.   Neck: Normal range of motion. Neck supple. No thyromegaly present.   Cardiovascular: Normal rate, regular rhythm, normal heart sounds and intact distal pulses.   Pulmonary/Chest: Effort normal and breath sounds normal.   Abdominal: Soft. Bowel sounds are normal.   Lymphadenopathy:     She has no cervical adenopathy.   Neurological: She is alert and oriented to person, place, and time.    Visual inspection:  Deformity/amputation: absent  Skin lesions/pre-ulcerative calluses: absent  Edema: right- negative, left- negative    Sensory exam:  Monofilament sensation: normal  (minimum of 5 random plantar locations tested, avoiding callused areas - > 1 area with absence of sensation is + for neuropathy)    Plus at least one of the following:  Pulses: normal        Skin: Skin is warm and dry. Capillary refill takes less than 2 seconds.   Psychiatric: She has a normal mood and affect. Her behavior is normal. Judgment and thought content normal.   Nursing note and vitals reviewed.      Assessment/Plan     1. Encounter to establish care    2. Type 2 diabetes mellitus without complication, without long-term current use of insulin (HCC)  A1c is at 6.5.  Continue with metformin 500 mg twice daily.  Diabetic foot exam and microalbumin are normal.  Discussed diet and exercise.  Will complete eye exam.  - POCT microalbumin  - POCT glycosylated hemoglobin (Hb A1C)  - metFORMIN (GLUCOPHAGE-XR) 500 MG extended release tablet; Take 1 tablet by mouth 2 times daily  Dispense: 60 tablet; Refill: 5  - Diabetic Foot Exam    3. Cervical cancer screening  - [facility] - Fennimore, Rachel Bo, MD, Gynecology, [place]    4. Need for diphtheria-tetanus-pertussis (Tdap) vaccine  - Tdap (ADACEL) 03-07-14.5 LF-MCG/0.5 injection; Inject 0.5 mLs into the muscle once for 1 dose  Dispense: 0.5 mL; Refill: 0      Orders Placed This Encounter   Procedures   ??? [facility] - Fenn[place], Rachel Bo, MD, Gynecology, [place]     Referral Priority:   Routine     Referral Type:   Eval and Treat     Referral Reason:   Specialty Services Required     Referred to Provider:   Wyn Quaker, MD     Requested Specialty:   Obstetrics & Gynecology     Number of Visits Requested:   1   ??? POCT microalbumin   ??? POCT glycosylated hemoglobin (Hb A1C)       Return  in about 3 months (around 05/10/2018) for DM- come fasting.    Jerrell MylarAmanda Marie Lenon Kuennen, NP    02/08/2018  4:01  PM

## 2018-08-08 ENCOUNTER — Ambulatory Visit: Admit: 2018-08-08 | Discharge: 2018-08-08 | Attending: Registered Nurse | Primary: Family Medicine

## 2018-08-08 DIAGNOSIS — E119 Type 2 diabetes mellitus without complications: Secondary | ICD-10-CM

## 2018-08-08 LAB — POCT GLYCOSYLATED HEMOGLOBIN (HGB A1C): Hemoglobin A1C: 6.6 %

## 2018-08-08 MED ORDER — METFORMIN HCL ER 500 MG PO TB24
500 MG | ORAL_TABLET | Freq: Two times a day (BID) | ORAL | 3 refills | Status: DC
Start: 2018-08-08 — End: 2018-08-28

## 2018-08-08 NOTE — Progress Notes (Signed)
[facility]  Clinic Note    Date: 08/08/2018                                               Subjective/Objective:     Chief Complaint   Patient presents with   ??? Diabetes     6 MONTHS FOLLOW UP FOR DM    ??? Medication Refill     PT HERE FOR METFORMIN MED REFILL        HPI patient presents for routine six-month diabetes follow-up.  Patient recently lost her insurance.    Diabetes Mellitus Type II:  Home blood sugar records: patient does not test.  No significant episodes of hypoglycemia.  No polyuria, polydipsia, visual changes, foot problems, GI upset.  Diabetic diet compliance:  compliant most of the time,  Weight trend: stable.  Current exercise: walking.  Eye exam current (within one year): no.     Lab Results   Component Value Date    LABA1C 6.5 02/08/2018    LABA1C 6.6 08/23/2017     Lab Results   Component Value Date    CREATININE <0.5 (L) 08/23/2017     Lab Results   Component Value Date    ALT 11 08/23/2017    AST 12 (L) 08/23/2017     No components found for: CHLPL  Lab Results   Component Value Date    TRIG 70 08/23/2017     Lab Results   Component Value Date    HDL 63 (H) 08/23/2017     Lab Results   Component Value Date    LDLCALC 122 (H) 08/23/2017           There are no active problems to display for this patient.      Past Medical History:   Diagnosis Date   ??? Type 2 diabetes mellitus without complication (HCC)        Past Surgical History:   Procedure Laterality Date   ??? CESAREAN SECTION     ??? CHOLECYSTECTOMY         Office Visit on 02/08/2018   Component Date Value Ref Range Status   ??? Microalb, Ur 02/08/2018 30 MG/L   Final   ??? Creatinine Ur POCT 02/08/2018 300 MG/DL   Final   ??? Microalbumin Creatinine Ratio 02/08/2018 <30 MG/G   Final   ??? Hemoglobin A1C 02/08/2018 6.5  % Final       Family History   Problem Relation Age of Onset   ??? Diabetes Mother    ??? Tuberculosis Father    ??? No Known Problems Sister    ??? No Known Problems Brother    ??? No Known Problems Brother    ??? No Known Problems  Sister    ??? No Known Problems Sister        Current Outpatient Medications   Medication Sig Dispense Refill   ??? metFORMIN (GLUCOPHAGE-XR) 500 MG extended release tablet Take 1 tablet by mouth 2 times daily 60 tablet 5     No current facility-administered medications for this visit.        No Known Allergies    Review of Systems   Constitutional: Negative for chills, diaphoresis, fatigue and fever.   Respiratory: Negative for cough, chest tightness, shortness of breath and wheezing.    Cardiovascular: Negative for chest pain, palpitations and  leg swelling.   Gastrointestinal: Negative for abdominal pain, constipation, diarrhea, nausea and vomiting.   Endocrine: Negative for polydipsia, polyphagia and polyuria.   Neurological: Negative for dizziness, weakness, light-headedness, numbness and headaches.   All other systems reviewed and are negative.      Vitals:  BP 120/70 (Site: Left Upper Arm, Position: Sitting, Cuff Size: Medium Adult)    Pulse 76    Resp 16    Ht 4' 11.25" (1.505 m)    Wt 152 lb 12.8 oz (69.3 kg) Comment: WITHOUT SHOES   LMP 07/19/2018    SpO2 98%    Breastfeeding? No    BMI 30.60 kg/m??     Physical Exam   Constitutional: She is oriented to person, place, and time. She appears well-developed and well-nourished.   HENT:   Head: Normocephalic and atraumatic.   Right Ear: Tympanic membrane, external ear and ear canal normal.   Left Ear: Tympanic membrane, external ear and ear canal normal.   Nose: Nose normal.   Mouth/Throat: Uvula is midline, oropharynx is clear and moist and mucous membranes are normal.   Eyes: Pupils are equal, round, and reactive to light. Conjunctivae and EOM are normal.   Neck: Normal range of motion. Neck supple. No thyromegaly present.   Cardiovascular: Normal rate, regular rhythm, normal heart sounds and intact distal pulses.   Pulmonary/Chest: Effort normal and breath sounds normal.   Abdominal: Soft. Bowel sounds are normal.   Lymphadenopathy:     She has no cervical  adenopathy.   Neurological: She is alert and oriented to person, place, and time.   Skin: Skin is warm and dry. Capillary refill takes less than 2 seconds.   Psychiatric: She has a normal mood and affect. Her behavior is normal. Judgment and thought content normal.   Nursing note and vitals reviewed.      Assessment/Plan     1. Type 2 diabetes mellitus without complication, without long-term current use of insulin (HCC)  A1c is 6.6.  Patient wishes to continue with current regimen.  Stressed the importance of diet and exercise.  Patient will look into eye doctor to complete diabetic eye exam.  She will go to the pharmacy to complete flu shot and other immunizations.  - metFORMIN (GLUCOPHAGE-XR) 500 MG extended release tablet; Take 1 tablet by mouth 2 times daily  Dispense: 180 tablet; Refill: 3  - POCT glycosylated hemoglobin (Hb A1C)      Orders Placed This Encounter   Procedures   ??? POCT glycosylated hemoglobin (Hb A1C)       Return in about 3 months (around 11/08/2018) for DM.    Jerrell MylarAmanda Marie Khailee Mick, NP    08/08/2018  3:50 PM

## 2018-08-28 ENCOUNTER — Encounter

## 2018-08-28 MED ORDER — METFORMIN HCL ER 500 MG PO TB24
500 MG | ORAL_TABLET | ORAL | 5 refills | Status: DC
Start: 2018-08-28 — End: 2019-02-03

## 2018-09-24 ENCOUNTER — Ambulatory Visit: Admit: 2018-09-24 | Discharge: 2018-09-24 | Attending: Registered Nurse | Primary: Family Medicine

## 2018-09-24 DIAGNOSIS — M722 Plantar fascial fibromatosis: Secondary | ICD-10-CM

## 2018-09-24 LAB — LIPID PANEL
Cholesterol, Total: 174 mg/dL (ref 0–199)
HDL: 55 mg/dL (ref 40–60)
LDL Calculated: 92 mg/dL (ref <100)
Triglycerides: 133 mg/dL (ref 0–150)
VLDL Cholesterol Calculated: 27 mg/dL

## 2018-09-24 LAB — COMPREHENSIVE METABOLIC PANEL
ALT: 12 U/L (ref 10–40)
AST: 12 U/L — ABNORMAL LOW (ref 15–37)
Albumin/Globulin Ratio: 1.8 (ref 1.1–2.2)
Albumin: 4.8 g/dL (ref 3.4–5.0)
Alkaline Phosphatase: 66 U/L (ref 40–129)
Anion Gap: 13 (ref 3–16)
BUN: 9 mg/dL (ref 7–20)
CO2: 21 mmol/L (ref 21–32)
Calcium: 9.1 mg/dL (ref 8.3–10.6)
Chloride: 101 mmol/L (ref 99–110)
Creatinine: <0.5 mg/dL — ABNORMAL LOW (ref 0.6–1.1)
GFR African American: >60 (ref >60)
GFR Non-African American: >60 (ref >60)
Globulin: 2.7 g/dL
Glucose: 133 mg/dL — ABNORMAL HIGH (ref 70–99)
Potassium: 4.7 mmol/L (ref 3.5–5.1)
Sodium: 135 mmol/L — ABNORMAL LOW (ref 136–145)
Total Bilirubin: 0.5 mg/dL (ref 0.0–1.0)
Total Protein: 7.5 g/dL (ref 6.4–8.2)

## 2018-09-24 MED ORDER — DICLOFENAC SODIUM POWD
Freq: Four times a day (QID) | 5 refills | Status: DC
Start: 2018-09-24 — End: 2019-10-28

## 2018-09-24 MED ORDER — PREDNISONE 10 MG PO TABS
10 MG | ORAL_TABLET | ORAL | 0 refills | Status: AC
Start: 2018-09-24 — End: 2018-10-04

## 2018-09-24 NOTE — Progress Notes (Signed)
Fasting labs drawn LA/mv

## 2018-09-24 NOTE — Patient Instructions (Signed)
Patient Education        Plantar Fasciitis: Care Instructions  Your Care Instructions    Plantar fasciitis is pain and inflammation of the plantar fascia, the tissue at the bottom of your foot that connects the heel bone to the toes. The plantar fascia also supports the arch. If you strain the plantar fascia, it can develop small tears and cause heel pain when you stand or walk.  Plantar fasciitis can be caused by running or other sports. It also may occur in people who are overweight or who have high arches or flat feet. You may get plantar fasciitis if you walk or stand for long periods, or have a tight Achilles tendon or calf muscles.  You can improve your foot pain with rest and other care at home. It might take a few weeks to a few months for your foot to heal completely.  Follow-up care is a key part of your treatment and safety. Be sure to make and go to all appointments, and call your doctor if you are having problems. It's also a good idea to know your test results and keep a list of the medicines you take.  How can you care for yourself at home?  ?? Rest your feet often. Reduce your activity to a level that lets you avoid pain. If possible, do not run or walk on hard surfaces.  ?? Take pain medicines exactly as directed.  ? If the doctor gave you a prescription medicine for pain, take it as prescribed.  ? If you are not taking a prescription pain medicine, take an over-the-counter anti-inflammatory medicine for pain and swelling, such as ibuprofen (Advil, Motrin) or naproxen (Aleve). Read and follow all instructions on the label.  ?? Use ice massage to help with pain and swelling. You can use an ice cube or an ice cup several times a day. To make an ice cup, fill a paper cup with water and freeze it. Cut off the top of the cup until a half-inch of ice shows. Hold onto the remaining paper to use the cup. Rub the ice in small circles over the area for 5 to 7 minutes.  ?? Contrast baths, which alternate hot and  cold water, can also help reduce swelling. But because heat alone may make pain and swelling worse, end a contrast bath with a soak in cold water.  ?? Wear a night splint if your doctor suggests it. A night splint holds your foot with the toes pointed up and the foot and ankle at a 90-degree angle. This position gives the bottom of your foot a constant, gentle stretch.  ?? Do simple exercises such as calf stretches and towel stretches 2 to 3 times each day, especially when you first get up in the morning. These can help the plantar fascia become more flexible. They also make the muscles that support your arch stronger. Hold these stretches for 15 to 30 seconds per stretch. Repeat 2 to 4 times.  ? Stand about 1 foot from a wall. Place the palms of both hands against the wall at chest level. Lean forward against the wall, keeping one leg with the knee straight and heel on the ground while bending the knee of the other leg.  ? Sit down on the floor or a mat with your feet stretched in front of you. Roll up a towel lengthwise, and loop it over the ball of your foot. Holding the towel at both ends, gently pull the   towel toward you to stretch your foot.  ?? Wear shoes with good arch support. Athletic shoes or shoes with a well-cushioned sole are good choices.  ?? Try heel cups or shoe inserts (orthotics) to help cushion your heel. You can buy these at many shoe stores.  ?? Put on your shoes as soon as you get out of bed. Going barefoot or wearing slippers may make your pain worse.  ?? Reach and stay at a good weight for your height. This puts less strain on your feet.  When should you call for help?  Call your doctor now or seek immediate medical care if:  ?? ?? You have heel pain with fever, redness, or warmth in your heel.   ?? ?? You cannot put weight on the sore foot.   ??Watch closely for changes in your health, and be sure to contact your doctor if:  ?? ?? You have numbness or tingling in your heel.   ?? ?? Your heel pain lasts  more than 2 weeks.   Where can you learn more?  Go to [email or web address] and sign in to your MyChart account. Enter X351 in the Search Health Information box to learn more about "Plantar Fasciitis: Care Instructions."     If you do not have an account, please click on the "Sign Up Now" link.  Current as of: July 26, 2017  Content Version: 12.1  ?? 2006-2019 [employer]. Care instructions adapted under license by [facility]. If you have questions about a medical condition or this instruction, always ask your healthcare professional. [employer] disclaims any warranty or liability for your use of this information.         Patient Education        Plantar Fasciitis: Exercises  Introduction  Here are some examples of exercises for you to try. The exercises may be suggested for a condition or for rehabilitation. Start each exercise slowly. Ease off the exercises if you start to have pain.  You will be told when to start these exercises and which ones will work best for you.  How to do the exercises  Towel stretch    1. Sit with your legs extended and knees straight.  2. Place a towel around your foot just under the toes.  3. Hold each end of the towel in each hand, with your hands above your knees.  4. Pull back with the towel so that your foot stretches toward you.  5. Hold the position for at least 15 to 30 seconds.  6. Repeat 2 to 4 times a session, up to 5 sessions a day.    Calf stretch    1. Stand facing a wall with your hands on the wall at about eye level. Put the leg you want to stretch about a step behind your other leg.  2. Keeping your back heel on the floor, bend your front knee until you feel a stretch in the back leg.  3. Hold the stretch for 15 to 30 seconds. Repeat 2 to 4 times.    Plantar fascia and calf stretch    1. Stand on a step as shown above. Be sure to hold on to the banister.  2. Slowly let your heels down over the edge of the step as you  relax your calf muscles. You should feel a gentle stretch across the bottom of your foot and up the back of your leg to your knee.  3. Hold the stretch about 15   to 30 seconds, and then tighten your calf muscle a little to bring your heel back up to the level of the step. Repeat 2 to 4 times.    Towel curls    1. While sitting, place your foot on a towel on the floor and scrunch the towel toward you with your toes.  2. Then, also using your toes, push the towel away from you.    Marble pickups    1. Put marbles on the floor next to a cup.  2. Using your toes, try to lift the marbles up from the floor and put them in the cup.    Follow-up care is a key part of your treatment and safety. Be sure to make and go to all appointments, and call your doctor if you are having problems. It's also a good idea to know your test results and keep a list of the medicines you take.  Where can you learn more?  Go to [email or web address] and sign in to your MyChart account. Enter X377 in the Search Health Information box to learn more about "Plantar Fasciitis: Exercises."     If you do not have an account, please click on the "Sign Up Now" link.  Current as of: July 26, 2017  Content Version: 12.1  ?? 2006-2019 [employer]. Care instructions adapted under license by [facility]. If you have questions about a medical condition or this instruction, always ask your healthcare professional. [employer] disclaims any warranty or liability for your use of this information.         Patient Education        Arch Pain: Exercises  Introduction  Here are some examples of exercises for you to try. The exercises may be suggested for a condition or for rehabilitation. Start each exercise slowly. Ease off the exercises if you start to have pain.  You will be told when to start these exercises and which ones will work best for you.  How to do the exercises  Plantar fascia stretch    1. Sit in a chair  and put your affected foot on your other knee.  2. Hold the heel of your foot in one hand, and grasp your toes with the other hand.  3. Pull on your heel (toward your body), and at the same time pull your toes back with your other hand.  4. You should feel a stretch along the bottom of your foot.  5. Hold 15 to 30 seconds.  6. Repeat 2 to 4 times.    Plantar fascia stretch (kneeling)    1. Get on your hands and knees on the floor. Keep your heels pointing up and the balls of your feet and your toes on the floor.  2. Slowly sit back toward your ankles.  3. If this is too hard, you can try doing it one leg at a time. Stand up, and then kneel on one knee and keep the other leg forward. Place the foot of your forward leg flat on the ground and bend that knee. The heel on the leg still behind you should point up. The ball and toes of that foot should be on the floor. Sit back toward that ankle.  4. Hold 15 to 30 seconds.  5. Repeat 2 to 4 times. Switch legs if you are doing this one leg at a time.    Plantar fascia self-massage    1. Sit in a chair.  2. Place your affected foot   on a firm, tube-shaped object, such as a can or water bottle.  3. Roll your foot back and forth over the object to massage the bottom of your foot.  4. If you want to do ice massage, fill a water bottle about three-fourths of the way full and freeze before using.  5. Continue for 2 to 5 minutes.    Bilateral calf stretch (knees straight)    1. Place a book on the floor a few inches from a wall or countertop, and put the balls of your feet on it. Your heels should be on the floor. The book needs to be thick enough so that you can feel a gentle stretch in each calf. If you are not steady on your feet, hold on to a chair, counter, or wall while you do this stretch.  2. Keep your knees straight, and lean forward until you feel a stretch in each calf.  3. To get more stretch, add another book or use a thicker book, such as a phone book, a dictionary,  or an encyclopedia.  4. Hold the stretch for at least 15 to 30 seconds.  5. Repeat 2 to 4 times.    Bilateral calf stretch (knees bent)    1. Place a book on the floor a few inches from a wall or countertop, and put the balls of your feet on it. Your heels should be on the floor. The book needs to be thick enough so that you can feel a gentle stretch in each calf. If you are not steady on your feet, hold on to a chair, counter, or wall while you do this stretch.  2. Bend your knees, and lean forward until you feel a stretch in each calf.  3. To get more stretch, add another book or use a thicker book, such as a phone book, a dictionary, or an encyclopedia.  4. Hold the stretch for at least 15 to 30 seconds.  5. Repeat 2 to 4 times.    Marble pick-ups    1. Put some marbles on the floor next to a cup.  2. Sit down, and use the toes of your affected foot to lift up one marble from the floor at a time. Then try to put the marble in the cup.  3. Repeat 8 to 12 times.    Towel scrunches    1. Sit down, and place your affected foot on a towel on the floor. You may also do this with both feet on the towel.  2. Scrunch the towel toward you with your toes. Then use your toes to push the towel back into place.  3. Repeat 8 to 12 times.    Heel raises on a step    1. Stand on the bottom step of a staircase, facing up toward the stairs. Put the balls of your feet on the step. If you are not steady on your feet, hold on to the banister or wall.  2. Keeping both knees straight, slowly lift your heels above the step so that you are standing on your toes. Then slowly lower your heels below the step and toward the floor.  3. Return to the starting position, with your feet even with the step.  4. Repeat 8 to 12 times.    Follow-up care is a key part of your treatment and safety. Be sure to make and go to all appointments, and call your doctor if you are having problems. It's also a   good idea to know your test results and keep a list  of the medicines you take.  Where can you learn more?  Go to [email or web address] and sign in to your MyChart account. Enter H119 in the Search Health Information box to learn more about "Arch Pain: Exercises."     If you do not have an account, please click on the "Sign Up Now" link.  Current as of: July 26, 2017  Content Version: 12.1  ?? 2006-2019 [employer]. Care instructions adapted under license by [facility]. If you have questions about a medical condition or this instruction, always ask your healthcare professional. [employer] disclaims any warranty or liability for your use of this information.

## 2018-09-24 NOTE — Progress Notes (Signed)
[facility]  Clinic Note    Date: 09/24/2018                                               Subjective/Objective:     Chief Complaint   Patient presents with   ??? Pain     PT C/O BOTH FEET PAIN, SACLE OF 8-10 PAIN, X3-4MONTHS, PT HAVE NOT TRIED ANY MED        HPI patient presents with complaints of pain in bottom of both feet for the last 4 months.  Describes pain as aching.  Worse when she is walking on her feet.  Denies any numbness or tingling.  No trauma.  No redness or swelling.  Located on her heel and arch of her foot.  States she does a lot of walking for work.  She is not wearing shoes at home.  She has not attempted to treat.  She is a type II diabetic-last A1c was 6.6.  Last foot exam was normal.  She is mostly wearing boots and slippers.  Not wearing gym shoes.       There are no active problems to display for this patient.      Past Medical History:   Diagnosis Date   ??? Type 2 diabetes mellitus without complication (HCC)        Past Surgical History:   Procedure Laterality Date   ??? CESAREAN SECTION     ??? CHOLECYSTECTOMY         Office Visit on 08/08/2018   Component Date Value Ref Range Status   ??? Hemoglobin A1C 08/08/2018 6.6  % Final       Family History   Problem Relation Age of Onset   ??? Diabetes Mother    ??? Tuberculosis Father    ??? No Known Problems Sister    ??? No Known Problems Brother    ??? No Known Problems Brother    ??? No Known Problems Sister    ??? No Known Problems Sister        Current Outpatient Medications   Medication Sig Dispense Refill   ??? metFORMIN (GLUCOPHAGE-XR) 500 MG extended release tablet TAKE 1 TABLET BY MOUTH TWICE A DAY 60 tablet 5     No current facility-administered medications for this visit.        No Known Allergies    Review of Systems   Constitutional: Negative for chills, diaphoresis, fatigue and fever.   Musculoskeletal: Positive for arthralgias (Bilateral feet). Negative for gait problem, joint swelling and myalgias.   Neurological: Negative for weakness  and numbness.   All other systems reviewed and are negative.      Vitals:  BP 120/78 (Site: Right Upper Arm, Position: Sitting, Cuff Size: Medium Adult)    Pulse 87    Temp 97.9 ??F (36.6 ??C) (Oral)    Resp 16    Ht 4' 11.25" (1.505 m)    Wt 150 lb 3.2 oz (68.1 kg) Comment: WITHOUT SHOES   LMP 09/07/2018    SpO2 99%    Breastfeeding? No    BMI 30.08 kg/m??     Physical Exam  Vitals signs and nursing note reviewed.   Constitutional:       Appearance: Normal appearance.   Cardiovascular:      Pulses: Normal pulses.  Dorsalis pedis pulses are 2+ on the right side and 2+ on the left side.        Posterior tibial pulses are 2+ on the right side and 2+ on the left side.   Musculoskeletal:      Right foot: Normal range of motion. No deformity, bunion, Charcot foot, foot drop or prominent metatarsal heads.      Left foot: Normal range of motion. No deformity, bunion, Charcot foot, foot drop or prominent metatarsal heads.   Feet:      Right foot:      Protective Sensation: 10 sites tested. 10 sites sensed.      Skin integrity: Skin integrity normal.      Left foot:      Protective Sensation: 10 sites tested. 10 sites sensed.      Skin integrity: Skin integrity normal.      Comments: Normal plantarflexion and dorsiflexion.  Normal circumflexion.  Normal inversion and eversion.  Tenderness upon palpation of bilateral heels and arches.  Skin:     General: Skin is warm and dry.      Capillary Refill: Capillary refill takes less than 2 seconds.   Neurological:      General: No focal deficit present.      Mental Status: She is alert and oriented to person, place, and time.      Sensory: No sensory deficit.   Psychiatric:         Mood and Affect: Mood normal.         Behavior: Behavior normal.         Thought Content: Thought content normal.         Judgment: Judgment normal.         Assessment/Plan     1. Plantar fasciitis, bilateral  Suspect plantar fasciitis.  Treat with steroid taper and biomed cream.  Educated to  purchase good pair of gym shoes-good arch support.  Given home stretches to complete.  She will follow-up with podiatry if no improvement.  - predniSONE (DELTASONE) 10 MG tablet; Take 6 tablets for 2 days, then 5 tablets for 2 days, then 4 tablets for 2 days, then 3 tablets for 2 days, then 2 tablets for 2 days, then 1 tablet for 2 days.  Divide dose for first 8 days.  Take with food.  Dispense: 42 tablet; Refill: 0  - Diclofenac Sodium POWD; 2 g by Does not apply route 4 times daily Formula 3D Baclo 2% Diclo 3% Gaba 6% Lido 2% Prilo 2% Pharm to comp  Dispense: 240 g; Refill: 5  - [place] [facility] [place], [place], DPM, Podiatry, [employer]    2. Type 2 diabetes mellitus without complication, without long-term current use of insulin (HCC)  She is fasting today and due for labs.  - Comprehensive Metabolic Panel; Future  - Lipid Panel; Future  - Lipid Panel  - Comprehensive Metabolic Panel      Orders Placed This Encounter   Procedures   ??? Comprehensive Metabolic Panel     Standing Status:   Future     Number of Occurrences:   1     Standing Expiration Date:   09/24/2019   ??? Lipid Panel     Standing Status:   Future     Number of Occurrences:   1     Standing Expiration Date:   09/24/2019     Order Specific Question:   Is Patient Fasting?/# of Hours     Answer:  10   ??? [place] - Charyl DancerYun, Roger ShelterGordon, DPM, Podiatry, [facility]     Referral Priority:   Routine     Referral Type:   Eval and Treat     Referral Reason:   Specialty Services Required     Referred to Provider:   Scarlette CalicoGordon H Yun, DPM     Requested Specialty:   Podiatry     Number of Visits Requested:   1       Return if symptoms worsen or fail to improve.    Jerrell MylarAmanda Marie Nancylee Gaines, NP    09/24/2018  8:52 AM

## 2019-02-02 DIAGNOSIS — E119 Type 2 diabetes mellitus without complications: Secondary | ICD-10-CM

## 2019-02-03 ENCOUNTER — Ambulatory Visit
Admit: 2019-02-03 | Discharge: 2019-02-03 | Payer: BLUE CROSS/BLUE SHIELD | Attending: Family Medicine | Primary: Family Medicine

## 2019-02-03 DIAGNOSIS — E119 Type 2 diabetes mellitus without complications: Secondary | ICD-10-CM

## 2019-02-03 LAB — POCT GLYCOSYLATED HEMOGLOBIN (HGB A1C): Hemoglobin A1C: 6.4 %

## 2019-02-03 MED ORDER — METFORMIN HCL ER 500 MG PO TB24
500 MG | ORAL_TABLET | ORAL | 5 refills | Status: DC
Start: 2019-02-03 — End: 2020-02-17

## 2019-02-03 NOTE — Progress Notes (Signed)
Subjective:      Patient ID: Sherece Hopper is a 38 y.o. female.  Chief Complaint   Patient presents with   . Diabetes     ROUTINE FOLLOW UP FOR DM- A1C DONE TODAY   . Pain     PT C/O OF FOOT PAIN, HEEL OF THE FEET X 4-5 MO    902  HPI     Type 2 diabetes mellitus without complication, without long-term current use of insulin (HCC)  Is checking BS at home sometimes ~ 1x/month.  Has been 130 about.  No sx of low sugar.  Plantar fasciitis, bilateral  Did not get the Biomed gel that was ordered for her. Had foot pain jfor . 5 months. No injury to foot.  Has to climb a lot of stairs at work.  Hurts when comes own.    Hypercholesterolemia  Is watching.  Foods high are fatty foods. Does fry food sometimes.     Class 1 obesity due to excess calories with serious comorbidity and body mass index (BMI) of 30.0 to 30.9 in adult  Is losing weight.  She is not sure why. Working at physical job for 1 year.  Wonders if we have checked her thyroid. No personal nor family history of thyroid probe.s   Heart hurts   Occurs every day for 2 weeks.  Is around and under the breast.  Not worse if exercising.  Never had this in the past. Not on daily ASA 81 mg. No meds taken for chest pain. Has heartburn sometimes. No known chest wall injury. Not worse with chest wall movement.  Last visit 09/24/2018    Prior to Visit Medications    Medication Sig Taking? Authorizing Provider   metFORMIN (GLUCOPHAGE-XR) 500 MG extended release tablet TAKE 1 TABLET BY MOUTH TWICE A DAY Yes Dorena Dew, APRN - CNP   Diclofenac Sodium POWD 2 g by Does not apply route 4 times daily Formula 3D Baclo 2% Diclo 3% Gaba 6% Lido 2% Prilo 2% Pharm to comp  Patient not taking: Reported on 02/03/2019  Laqueta Due, APRN - CNP       Review of Systems   Constitutional: Negative for chills, diaphoresis, fatigue, fever and unexpected weight change.   Respiratory: Negative for cough, choking, chest tightness, shortness of breath and wheezing.    Cardiovascular:  Positive for chest pain. Negative for palpitations and leg swelling.   Neurological: Positive for weakness. Negative for dizziness, light-headedness and headaches.     Objective:   Physical Exam  Vitals signs and nursing note reviewed.   Constitutional:       General: She is not in acute distress.     Appearance: Normal appearance. She is well-developed and overweight. She is not ill-appearing or diaphoretic.   Eyes:      General: No scleral icterus.        Right eye: No discharge.         Left eye: No discharge.      Conjunctiva/sclera: Conjunctivae normal.   Neck:      Musculoskeletal: Neck supple.      Thyroid: No thyroid mass or thyromegaly.      Vascular: No carotid bruit or JVD.      Trachea: Trachea and phonation normal. No tracheal deviation.   Cardiovascular:      Rate and Rhythm: Normal rate and regular rhythm.  No extrasystoles are present.     Heart sounds: Normal heart sounds, S1 normal and S2 normal.  Heart sounds not distant. No murmur. No systolic murmur. No diastolic murmur. No friction rub. No gallop. No S3 or S4 sounds.    Pulmonary:      Effort: Pulmonary effort is normal. No respiratory distress.      Breath sounds: Normal breath sounds. No decreased breath sounds, wheezing, rhonchi or rales.   Chest:      Chest wall: Tenderness present.       Musculoskeletal:        Feet:    Lymphadenopathy:      Head:      Right side of head: No submandibular or tonsillar adenopathy.      Left side of head: No submandibular or tonsillar adenopathy.      Cervical: No cervical adenopathy.      Right cervical: No superficial, deep or posterior cervical adenopathy.     Left cervical: No superficial, deep or posterior cervical adenopathy.   Skin:     General: Skin is warm and dry.      Coloration: Skin is not pale.      Nails: There is no clubbing.     Neurological:      Mental Status: She is alert.      Deep Tendon Reflexes:      Reflex Scores:       Patellar reflexes are 2+ on the right side and 2+ on the left  side.  Psychiatric:         Speech: Speech normal.         Behavior: Behavior normal. Behavior is cooperative.         Judgment: Judgment normal.       Vitals:    02/03/19 0851   BP: 110/60   Site: Left Upper Arm   Position: Sitting   Cuff Size: Medium Adult   Pulse: 82   Temp: 98.4 F (36.9 C)   TempSrc: Oral   SpO2: 98%   Weight: 145 lb 9.6 oz (66 kg)   Height: 4' 11.25" (1.505 m)     BP Readings from Last 3 Encounters:   02/03/19 110/60   09/24/18 120/78   08/08/18 120/70     Pulse Readings from Last 3 Encounters:   02/03/19 82   09/24/18 87   08/08/18 76     Wt Readings from Last 3 Encounters:   02/03/19 145 lb 9.6 oz (66 kg)   09/24/18 150 lb 3.2 oz (68.1 kg)   08/08/18 152 lb 12.8 oz (69.3 kg)     Body mass index is 29.16 kg/m.     08/23/2017 10:06 09/24/2018 09:10   Cholesterol, Total 199 174   HDL Cholesterol 63 (H) 55   LDL Calculated 122 (H) 92   Triglycerides 70 133      Ref. Range 08/23/2017 10:06 02/08/2018 16:21 08/08/2018 16:00   Hemoglobin A1C 6.4 % goal 6.6 6.5 6.6     Results for POC orders placed in visit on 02/03/19   POCT glycosylated hemoglobin (Hb A1C)   Result Value Ref Range    Hemoglobin A1C 6.4 %     Assessment:      1. Type 2 diabetes mellitus without complication, without long-term current use of insulin (HCC)    2. Plantar fasciitis, bilateral    3. Hypercholesterolemia    4. Class 1 obesity due to excess calories with serious comorbidity and body mass index (BMI) of 30.0 to 30.9 in adult    5. Left Achilles tendinitis    6. Left  foot pain    7. Costochondritis, acute          Plan:    959  - Counseling More Than 50% of the 40 min Appointment Time     Angala was seen today for diabetes and pain.    Diagnoses and all orders for this visit:    Type 2 diabetes mellitus without complication, without long-term current use of insulin (HCC)  -     POCT glycosylated hemoglobin (Hb A1C)  -     Good control.  -     Continue meds and lifestyle control.     Plantar fasciitis, bilateral  Call  about gel.  Buy a shoe insert with a hole in the mid heel.  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     Hypercholesterolemia  -     Good control.  -     Continue meds and lifestyle control.     Class 1 obesity due to excess calories with serious comorbidity and body mass index (BMI) of 30.0 to 30.9 in adult  Losing weight - congratulations! Will help long term.     Left Achilles tendinitis  See below. Use the Biomed 3-4x/day.     Left foot pain  Exercises and Biomed 3-4.     Costochondritis, acute  Heating pad to chest wall.    Needs Pneumovax, diabetic retinal exam, Tdap    Patient Education   ???????? ???????????: ??????? ???????????  Plantar Fasciitis: Care Instructions    Patient Education   ???????? ???????????: ??????????  Plantar Fasciitis: Exercises    Patient Education   ?????? ??????: ??????? ???????????  Foot Pain: Care Instructions    Patient Education   Foot Arthritis: Exercises    Patient Education   Achilles Tendon: Exercises        Cedric FishmanJohn D Josiephine Simao, DO

## 2019-02-03 NOTE — Patient Instructions (Addendum)
Wendy Frank was seen today for diabetes and pain.    Diagnoses and all orders for this visit:    Type 2 diabetes mellitus without complication, without long-term current use of insulin (HCC)  -     POCT glycosylated hemoglobin (Hb A1C)  -     Good control.  -     Continue meds and lifestyle control.     Plantar fasciitis, bilateral  Call about gel.  Buy a shoe insert with a hole in the mid heel.  Dr Margart Sickles counter Wendy Frank will have.     Hypercholesterolemia  -     Good control.  -     Continue meds and lifestyle control.     Class 1 obesity due to excess calories with serious comorbidity and body mass index (BMI) of 30.0 to 30.9 in adult  Losing weight - congratulations! Will help long term.     Left Achilles tendinitis  See below. Use the Biomed 3-4x/day.     Left foot pain  Exercises and Biomed 3-4.     Costochondritis, acute  Heating pad to chest wall.      Patient Education        ???????? ???????????: ??????? ???????????  Plantar Fasciitis: Care Instructions  ??????? ??????? ???????????    ???????? ??????????? ?? ????? ? ???????? ?????????? ??? ??, ??????? ???????? ????? ????? ????? ????? ???? ???????? ????????? ???????????? ????? ???? ????? ???????? ?????????? ??? ??????? ?????? ????? ??????? ???????? ???????? ?????? ????? ???, ????? ?????? ?? ??????? ???? ???? ?????? ????? ???? ???? ? ?????? ????? ??? ?????  ???????? ??????????? ?????? ?? ???? ????????? ??? ??????? ???? ??? ?? ???? ???? ?? ???????? ?????? ???????? ?????????? ?? ??? ??? ????? ????? ???? ????? ???? ????? ?? ?????? ??? ?? ????????? ?? ????? ??????????? ????? ??? ???, ??????? ???????? ??????????? ??????? ???? ???????????  ??????? ????? ???? ???? ? ???? ?????????? ???????? ??????? ????? ???? ??????????? ??????? ?????? ????????? ???? ????? ???? ???? ????? ???? ???? ????????? ????? ?????  ???-?? ?????? ??????? ????? ??? ????????? ????? ??? ??? ??? ????????????? ??? ??? ????? ??????? ????????? ???? ??? ???????? ????????? ??????? ? ??? ????? ?????????  ?? ?????????? ????? ?????? ????????? ???? ????? ??? ????? ???? ????????? ???? ?????? ??? ?? ?????? ????? ???  ??????? ???? ????? ??????? ???? ???? ???????????   ??????? ????????? ?????????? ?????????? ??????? ??????? ??????? ?? ?????????? ???? ???? ???????? ????? ??? ?????? ??? ????? ????,??? ???????? ????? ?? ??????? ???????????   ???????? ??? ?????? ??????? ??????? ???? ??????????  ? ???????? ???????? ??????? ???? ????????? ???? ????????? ???, ???????? ??? ?????? ???? ??????????  ? ??? ??????? ??????? ???? ????????? ????? ???? ??????????? ??? ???,?????? ???? ????? ???? ?? ??? ????? ???? ?????-????????? (??????, ???????) ?? ?????????? (????)????????? ?????? ??? ??????????? ????????? ? ???? ??????????   ????? ? ?????? ???????? ??????? ???? ??? ???? ?????? ?????????? ????? ?? ??? ????? ?? ?? ????? ????? ?? ????? ???? ??????? ?????? ???? ??????????? ??? ?? ??????? ????,?????? ???? ?? ????????? ? ????? ????? ?????????? ??? ???? ??? ??????????? ???? ?????? ??? ??????????? ?????? ???? ????? ?? ?????? ??????????? ?????????? ?????? ???? ????????? 5 ???? 7 ????????? ???????????   ?????? ?????,???? ???????? ????? ? ???? ????, ?????? ?? ???? ??? ????? ???? ????? ?? ?????? ????? ? ?????? ????? ????, ????? ???? ?????? ??????? ?????????? ????? ????????   ??????? ???????? ????? ????????? ???? ?????? ???? ??????????? ???? ?????? ?????? ???????? ??????? ? ???????? ??????????? 90 ?????? ????? ????????? ?? ???????? ??????? ????????? ?????,??? ??????? ????????????   ????? ??? ?? ????? ?????? ??????????,?????? ????????? ?????-???? ???????? ? ?????? ??????? ????? ???? ???  2 ???? 3 ??????? ??? ??????? ?????????? ???? ???????? ????????? ?? ????? ??? ????? ??????????? ????????? ??????? ???? ??????????? ????? ?????? ????? ??? ??????? ?? ???????????? ???? ???????? ???? 15 ???? 30 ??????????? ??????????? 2 ???? 4 ??????? ?????????????????  ? ????????? 1 ??? ?? ?????????? ???? ????? ?????????? ?????? ?????? ??????????? ???? ????????  ????? ???? ?????????? ? ????? ???????? ?????????? ???????? ??????? ???????? ??????????? ?????? ????? ??????????  ? ??????? ?????? ????? ???????? ???? ?? ?????????? ?????????? ???????? ???????????? ??????? ???????? ?????? ??????? ????? ??????????? ???????? ???? ????????? ??????? ??????? ?????? ??????? ?????? ???????????   ?????? ???? ??????? ???? ?????? ??????????? ?????? ???? ??? ???? ?????????? ????????? ?????? ?????? ?????   ??? ?? ?? ?????? ??????(???????????)??????? ???? ?????? ????? ????? ????? ?????? ????????? ??????? ???? ?????????? ?? ?????? ????? ???????????   ?????? ????? ??????? ???????? ?????? ??????????? ???? ?????? ?? ????? ????? ????????? ??????? ???????? ???? ????? ?????   ??????? ?????? ???? ?????? ????? ?????????? ? ????? ????????? ???? ??????? ???????? ?? ???? ??????  ??????? ??????? ???? ????? ??? ??????????  ????? ????????? ????? ?? ????????? ?? ???????? ???????? ??????? ??????????, ???:   ???????? ??????? ????? ?????, ?????? ?? ???????? ??? ??? ?????? ??   ??????? ??? ???? ???????? ??? ????? ???????????  ??????? ??????????? ???? ????????????? ???? ???????? ??????? ????????? ? ??? ????? ???? ????? ????????? ??????? ???? ??????? ?????????:   ???????? ??????? ????? ????????????? ?? ???????? ???? ??   ??????? ????? ????? 2 ????? ????? ??? ???? ??????  ??????? ?? ???? ????? ???????????  ???? ????????   [email or web address]  ???????? ????????? X351 ?????? ?? ????? ??? ?????? "???????? ???????????: ??????? ???????????."  ?? ????? ???: ??? 26, 2019????????? ?????: 12.4   2006-2020 [employer].  ???????? ?????????????? ?????? ???????? ???????? ??????? ????????? ?????? ????? ??????? ??? ???????? ?? ???????? ?????? ?? ?? ?????????? ?????? ????????? ??? ???, ???? ????? ????????? ?????? ????? ????????? ??????????? [employer], ?? ??????? ?? ????????? ???????? ???? ???? ??? ???????? ?? ?????????? ???????? ??????         Patient Education        ????????  ???????????: ??????????  Plantar Fasciitis: Exercises  ?????  ???? ??????? ???? ?????? ?????????? ???????????? ???? ????????? ???? ?? ?????????? ?????? ?? ?????????????? ???? ??????? ???????? ???????? ??????? ???????? ???? ?????????? ??? ???????? ???? ??? ???? ???? ? ??? ?????????? ?? ??????????  ???????? ?? ?????????? ????? ??????? ????? ? ??????? ???? ??? ????? ???????? ????? ????? ???? ??????? ??????  ?????????? ???? ?????  ???? ???????   1. ?????? ??????? ? ????? ???? ????? ??????????  2. ???????? ?????? ??? ????? ??????? ?????? ?????? ?????? ???????????  3. ????? ?????? ????????? ????? ???? ????? ???????? ???????? ??? ????????????  4. ?????? ?????? ????? ?????????? ???? ??????? ?????? ????????? ???????????  5. ?? ????? ??????? 15 ???? 30 ??????????? ??????????  6. ?? ?????? 2 ???? 4 ??????? ????????????????, ?? ????? 5 ???????? ??????????    ????? ???????   ?? ????????? ????? ???????? ????????(?????) ? ????????? ??????????? ????????? ?? ??????? 5 ???, 3 ?? 4 ??????? ?? ??????? ??????????  1. ???????? ???? ?????? ???? ???? ??????? ????????? ????? ????????? ??????????? ??????? ????? ?????? ????? ????? ??????? ?????????? ?? ????? ?????? ???????????  2. ??????? ??????? ???????? ??????? ????? ?????????? ????? ??????? ??? ?????? ???? ????? ??????? ????? ???? ????????????????  3. 15 ???? 30 ??????????? ?? ?????? ???? ??????????? 2 ???? 4 ??????? ?????????????????    ???????? ???????? ? ????? ???????   ???????? ???????? ? ????? ??????????? ?????????? ???????? ????? ? ????? ???????? ?? ???? ????? ???????? ???????? ??? ? ??????????????? ?? ?????? ???? ??? ?? ?????????? ???? ???????????  1. ???? ???????? ????? ????? ????????? ???????? ????? ?????? ???? ??????? ?????????  2. ????? ????? ????? ????????????? ????????? ????? ????? ?????????? ???????? ????? ?????? ?? ??????????? ??????? ????? ???????? ?? ? ??????? ??????? ???? ????? ?????? ????? ??????? ??????? ???? ????? ???????????  3. 15 ???? 30 ??????????? ?????????? ?????  ????????? ? ??????? ????? ?????? ???? ??????? ??? ???? ????? ?????? ??????? ????? ??????????? ???????? ?????????? 2 ???? 4 ??????? ?????????????????    ????? ???????   ??????? ????? ???????? ????? ???????? ?????; ??? ????? ????? ?? ?????????? ?? ??????????? ???????????  1. ???????? ??????, ?????? ??????? ?????? ??????? ?????????? ? ????? ???????? ????????? ???????? ??????? ????????????  2. ????????, ????? ???????? ??????? ?????? ???? ???????? ???? ????? ?? ??? ????????????    ?????? ????????   1. ????????? ????? ????? ?????? ???????????  2. ????? ???????? ??????? ?????? ???? ??????? ??????????? ???? ?????? ?????? ????????? ? ?????????? ???? ???????????    ???-?? ?????? ??????? ????? ??? ????????? ????? ??? ??? ??? ????????????? ??? ??? ????? ??????? ????????? ???? ??? ???????? ????????? ??????? ? ??? ????? ????????? ?? ?????????? ????? ?????? ????????? ???? ????? ??? ????? ???? ????????? ???? ?????? ??? ?? ?????? ????? ???  ??????? ?? ???? ????? ???????????  ???? ????????   [email or web address]  ???????? ????????? X377 ?????? ?? ????? ??? ?????? "???????? ???????????: ??????????."  ?? ????? ???: ??? 26, 2019????????? ?????: 12.4   2006-2020 [employer].  ???????? ?????????????? ?????? ???????? ???????? ??????? ????????? ?????? ????? ??????? ??? ???????? ?? ???????? ?????? ?? ?? ?????????? ?????? ????????? ??? ???, ???? ????? ????????? ?????? ????? ????????? ??????????? [employer], ?? ??????? ?? ????????? ???????? ???? ???? ??? ???????? ?? ?????????? ???????? ??????         Patient Education        ?????? ??????: ??????? ???????????  Foot Pain: Care Instructions  ??????? ??????? ???????????  ???????? ??????????? ???? ????? ? ???????? ???? ????? ??????? ????? ???? ??? ?????? ?? ???? ????? ????? ????????????? ?????? ????? ?????? ???? ??? ??? ????????? ????? ???????? ????? ??? ?????? ?????, ??????? ???-????? ??? ????? ?????? ????????? ?????  ??????? ??????? ???????? ?????????  ???? ????? ??????? ? ??????? ?????: ???? ????? ???????????? ???????? ????? ????????? ?????? ? ??? ???????? ?????????? ? ??????? ???????? ???? ?????  ???-?? ?????? ??????? ????? ??? ????????? ????? ??? ??? ??? ????????????? ??? ??? ????? ??????? ????????? ???? ??? ???????? ????????? ??????? ? ??? ????? ????????? ?? ?????????? ????? ?????? ????????? ???? ????? ??? ????? ???? ????????? ???? ?????? ??? ?? ?????? ????? ???  ??????? ???? ????? ??????? ???? ???? ???????????   ???????? ??? ?????? ??????? ??????? ???? ??????????  ? ???????? ???????? ??????? ???? ????????? ???? ????????? ???, ???????? ??? ?????? ???? ??????????  ? ??? ??????? ??????? ????????? ???? ???? ??????? ???? ??? ???, ??????? ???????? ???????? ???? ???? ??????? ???? ?????????? ?? ?????????? ??? ????? ????????? ????????????   ???? ????????? ? ??????? ???????? ??????? ?????????? ????? ??? ????? ???? ????????????? ??????? ?????????   ??? ????? 10 ???? 20 ????????? ??????? ???????? ??? ?? ???? ????? ??????????? ??? ? ??????? ?????? ????? ?? ????? ???? ???????????   ??????? ??? ???? ???????? ????? 3 ????? ??? ??????? ?? ????? ?? ??????? ???? ????????? ???????? ??????????? ??????? ?????? ????????? ???? ?????? ?????? ?????????? ???? ????? ?? ???? ????? ???????   ??????? ????? ?????? ???????? ???????????? ?????? ???? ??? ??????? ???????? ??????? ???? ??????????? ??????? ???????? ????? ????????? ????? ???? ????? ???? ??????? ?????? ?????? ??? ???????????   ??????? ???????? ?????? ??????? ?????????? ???, ???????? ??? ?????? ?????? ??????????   ??????? ?????? ???????????   ????? ? ?????????? ?????? ???? ????????, ??????? ???????? ???? ????? ??????? ???? ?????????? ???????? ??? ????????? ????? ?????? ??????? ???????? ????? ???????????  ??????? ??????? ???? ????? ??? ??????????  ???????? ???? ??? ??? ??????? ?????? ?????? ???? ????? ??????  911 ?? ?? ?????????? ???????? ????, ????? ???????? ?? ?????????:   ??????? ?????? ????, ????, ???? ?? ???? ????  ????  ????? ????????? ????? ?? ????????? ?? ???????? ???????? ??????? ??????????, ???:   ??????? ????? ???????? ????? ?? ???? ?????????? ????   ??????? ?????? ???????? ?? ?????? ??????? ????????? ????? ????? ????????   ?????? ??? ?? ?????? ??????? ?????? ????? ?????? ????   ???????? ????????? ???????? ?, ?????:  ? ????? ?????, ????????, ???? ?? ???????  ? ??? ???? ?????????? ???? ??????? ????? ???? ????  ? ??????? ???????? ?????? ??? ??? ?????? ???  ? ??????   ??????? ?????? ?????? ?? ??????? ??????  ??????? ??????????? ???? ????????????? ???? ???????? ??????? ????????? ? ??? ????? ???? ????? ????????? ??????? ???? ??????? ?????????:   ????? ??????? ??? ?????? ???? ???????? ????   ???????? ???? 2 ?????????? ???? ??????? ?????? ????????? ???? ??  ??????? ?? ???? ????? ???????????  ???? ????????   [email or web address]  ???????? ????????? [id number] ?????? ?? ????? ??? ?????? "?????? ??????: ??????? ???????????."  ?? ????? ???: ??? 26, 2019????????? ?????: 12.4   2006-2020 [employer].  ???????? ?????????????? ?????? ???????? ???????? ??????? ????????? ?????? ????? ??????? ??? ???????? ?? ???????? ?????? ?? ?? ?????????? ?????? ????????? ??? ???, ???? ????? ????????? ?????? ????? ????????? ??????????? [employer], ?? ??????? ?? ????????? ???????? ???? ???? ??? ???????? ?? ?????????? ???????? ??????         Patient Education        Foot Arthritis: Exercises  Introduction  Here are some examples of exercises for you to try. The exercises may be suggested for a condition or for rehabilitation. Start each exercise slowly. Ease off the exercises if you start to have pain.  You will be told when to start these exercises and which ones will work best for you.  How to do the exercises  Calf stretch (knees straight)   1. Place a book on the floor a few inches from a wall or countertop, and put the balls of your feet on it. Your heels should be on the floor. The book needs to be thick  enough so that you can feel a gentle stretch in your calf. If you are not steady on your feet, hold on to a chair, counter, or wall while you do this stretch.  2. Keep your knees straight, and lean forward until you feel a stretch in your calf.  3. To get more stretch, add another book or use a thicker book, such as a phone book, a dictionary, or an Financial planner.  4. Hold the stretch for at least 15 to 30 seconds.  5. Repeat 2 to 4 times.    Calf stretch (knees bent)   1. Place a book on the floor a few inches from a wall or countertop, and put the balls of your feet on it. Your heels should be on the floor. The book needs to be thick enough so that you can feel a gentle stretch in your calf. If you are not steady on your feet, hold on to a chair, counter, or wall while you do this stretch.  2. Bend your knees, and lean forward until you feel a stretch in your calf.  3. To get more stretch, add another book or use a thicker book, such as a phone book, a dictionary, or an Financial planner.  4. Hold the stretch for at least 15 to 30 seconds.  5. Repeat 2 to 4 times.    Great toe extension  stretch   1. Sit in a chair, and put your affected foot across your other knee.  2. Grasp your heel with one hand and then slowly pull your big toe back with your other hand. Pull your toe back toward your ankle until you feel a stretch along the bottom of your foot.  3. Hold the stretch for at least 15 to 30 seconds.  4. Repeat 2 to 4 times.  5. Switch feet and repeat steps 1 through 4, even if only one foot is sore.    Great toe flexion stretch   1. Sit in a chair, and put your affected foot across your other knee.  2. Grasp your heel with one hand and then slowly push your big toe down with your other hand. Push your toe down and away from your ankle until you feel a stretch along the top of your foot.  3. Hold the stretch for at least 15 to 30 seconds.  4. Repeat 2 to 4 times.  5. Switch feet and repeat steps 1 through 4, even if  only one foot is sore.    Ankle alphabet   1. Sit in a chair with your feet flat on the floor. (You can also do this exercise lying on your back with your affected leg propped up on a pillow).  2. Lift the heel of your sore foot off the floor, and slowly trace the letters of the alphabet.  3. Switch feet and repeat steps 1 through 2, even if only one foot is sore.    Resisted ankle dorsiflexion   1. Tie the ends of an exercise band together to form a loop. Attach one end of the loop to a secure object or shut a door on it to hold it in place. (Or you can have someone hold one end of the loop to provide resistance.)  2. While sitting on the floor or in a chair, loop the other end of the band over the top of your affected foot.  3. Keeping your knee and leg straight, slowly flex your foot to pull back on the exercise band, and then slowly relax.  4. Repeat 8 to 12 times.  5. Switch feet and repeat steps 1 through 4, even if only one foot is sore.    Towel curl   1. While sitting, place your affected foot on a towel on the floor, and scrunch the towel toward you with your toes.  2. Then use your toes to push the towel away from you.  3. Repeat 8 to 12 times.  4. Switch feet and repeat steps 1 through 3, even if only one foot is sore.    Resisted ankle eversion   1. Sit on the floor with your legs straight.  2. Hold both ends of an exercise band and loop the band around the outside of your affected foot. Then press your other foot against the band.  3. Keeping your leg straight, slowly push your affected foot outward against the band and away from your other foot without letting your leg rotate. Then slowly relax.  4. Repeat 8 to 12 times.  5. Switch feet and repeat steps 1 through 4, even if only one foot is sore.    Resisted ankle inversion   1. Sit on the floor with your good leg crossed over your other leg.  2. Hold both ends of an exercise band and loop the band around the inside of your affected foot.  Then press  your other foot against the band.  3. Keeping your legs crossed, slowly push your affected foot against the band so that foot moves away from your other foot. Then slowly relax.  4. Repeat 8 to 12 times.  5. Switch feet and repeat steps 1 through 4, even if only one foot is sore.    Follow-up care is a key part of your treatment and safety. Be sure to make and go to all appointments, and call your doctor if you are having problems. It's also a good idea to know your test results and keep a list of the medicines you take.  Where can you learn more?  Go to [email or web address] and sign in to your MyChart account. Enter K113 in the Search Health Information box to learn more about "Foot Arthritis: Exercises."     If you do not have an account, please click on the "Sign Up Now" link.  Current as of: June 26, 2019Content Version: 12.4   2006-2020 [employer].  Care instructions adapted under license by [facility]. If you have questions about a medical condition or this instruction, always ask your healthcare professional. [employer] disclaims any warranty or liability for your use of this information.         Patient Education        Achilles Tendon: Exercises  Introduction  Here are some examples of exercises for you to try. The exercises may be suggested for a condition or for rehabilitation. Start each exercise slowly. Ease off the exercises if you start to have pain.  You will be told when to start these exercises and which ones will work best for you.  Toe stretch  Toe stretch   1. Sit in a chair, and extend your affected leg so that your heel is on the floor.  2. With your hand, reach down and pull your big toe up and back. Pull toward your ankle and away from the floor.  3. Hold the position for at least 15 to 30 seconds.  4. Repeat 2 to 4 times a session, several times a day.    Calf-plantar fascia stretch   1. Sit with your legs extended and knees  straight.  2. Place a towel around your foot just under the toes.  3. Hold each end of the towel in each hand, with your hands above your knees.  4. Pull back with the towel so that your foot stretches toward you.  5. Hold the position for at least 15 to 30 seconds.  6. Repeat 2 to 4 times a session, up to 5 sessions a day.    Floor stretch   1. Stand about 2 feet from a wall, and place your hands on the wall at about shoulder height. Or you can stand behind a chair, placing your hands on the back of it for balance.  2. Step back with the leg you want to stretch. Keep the leg straight, and press your heel into the floor with your toe turned slightly in.  3. Lean forward, and bend your other leg slightly. Feel the stretch in the Achilles tendon of your back leg. Hold for at least 15 to 30 seconds.  4. Repeat 2 to 4 times a session, up to 5 sessions a day.    Stair stretch   1. Stand with the balls of both feet on the edge of a step or curb (or a medium-sized phone book). With at least one hand,  hold onto something solid for balance, such as a banister or handrail.  2. Keeping your affected leg straight, slowly let that heel hang down off of the step or curb until you feel a stretch in the back of your calf and/or Achilles area. Some of your weight should still be on the other leg.  3. Hold this position for at least 15 to 30 seconds.  4. Repeat 2 to 4 times a session, up to 5 times a day or whenever your Achilles tendon starts to feel tight. This stretch can also be done with your knee slightly bent.    Strength exercise   1. This exercise will get you started on building strength after an Achilles tendon injury. Your doctor or physical therapist can help you move on to more challenging exercises as you heal and get stronger.  2. Stand on a step with your heel off the edge of the step. Hold on to a handrail or wall for balance.  3. Push up on your toes, then slowly count to 10 as you lower yourself back down until  your heel is below the step. If it hurts to push up on your toes, try putting most of your weight on your other foot as you push up, or try using your arms to help you. If you can't do this exercise without causing pain, stop the exercise and talk to your doctor.  4. Repeat the exercise 8 to 12 times, half with the knee straight and half with the knee bent.    Follow-up care is a key part of your treatment and safety. Be sure to make and go to all appointments, and call your doctor if you are having problems. It's also a good idea to know your test results and keep a list of the medicines you take.  Where can you learn more?  Go to [email or web address] and sign in to your MyChart account. Enter [phone] in the Search Health Information box to learn more about "Achilles Tendon: Exercises."     If you do not have an account, please click on the "Sign Up Now" link.  Current as of: June 26, 2019Content Version: 12.4   2006-2020 [employer].  Care instructions adapted under license by [facility]. If you have questions about a medical condition or this instruction, always ask your healthcare professional. [employer] disclaims any warranty or liability for your use of this information.

## 2019-03-07 ENCOUNTER — Ambulatory Visit
Admit: 2019-03-07 | Discharge: 2019-03-07 | Payer: BLUE CROSS/BLUE SHIELD | Attending: Family Medicine | Primary: Family Medicine

## 2019-03-07 DIAGNOSIS — Z20822 Contact with and (suspected) exposure to covid-19: Secondary | ICD-10-CM

## 2019-03-07 NOTE — Patient Instructions (Signed)
Advance Care Planning  People with COVID-19 may have no symptoms, mild symptoms, such as fever, cough, and shortness of breath or they may have more severe illness, developing severe and fatal pneumonia.  As a result, Advance Care Planning with attention to naming a health care decision maker (someone you trust to make healthcare decisions for you if you could not speak for yourself) and sharing other health care preferences is important BEFORE a possible health crisis. Please contact your Primary Care Provider to discuss Advance Care Planning.    Preventing the Spread of Coronavirus Disease 2019 in Homes and Residential Communities  For the most recent information go to https://www.cdc.gov/coronavirus/2019-ncov/hcp/guidance-prevent-spread.html    Prevention steps for People with confirmed or suspected COVID-19 (including persons under investigation) who do not need to be hospitalized  and   People with confirmed COVID-19 who were hospitalized and determined to be medically stable to go home    Your healthcare provider and public health staff will evaluate whether you can be cared for at home. If it is determined that you do not need to be hospitalized and can be isolated at home, you will be monitored by staff from your local or state health department. You should follow the prevention steps below until a healthcare provider or local or state health department says you can return to your normal activities.  Stay home except to get medical care  People who are mildly ill with COVID-19 are able to isolate at home during their illness. You should restrict activities outside your home, except for getting medical care. Do not go to work, school, or public areas. Avoid using public transportation, ride-sharing, or taxis.  Separate yourself from other people and animals in your home  People: As much as possible, you should stay in a specific room and away from other people in your home. Also, you should use a separate  bathroom, if available.  Animals: You should restrict contact with pets and other animals while you are sick with COVID-19, just like you would around other people. Although there have not been reports of pets or other animals becoming sick with COVID-19, it is still recommended that people sick with COVID-19 limit contact with animals until more information is known about the virus. When possible, have another member of your household care for your animals while you are sick. If you are sick with COVID-19, avoid contact with your pet, including petting, snuggling, being kissed or licked, and sharing food. If you must care for your pet or be around animals while you are sick, wash your hands before and after you interact with pets and wear a facemask.  Call ahead before visiting your doctor  If you have a medical appointment, call the healthcare provider and tell them that you have or may have COVID-19. This will help the healthcare provider???s office take steps to keep other people from getting infected or exposed.  Wear a facemask  You should wear a facemask when you are around other people (e.g., sharing a room or vehicle) or pets and before you enter a healthcare provider???s office. If you are not able to wear a facemask (for example, because it causes trouble breathing), then people who live with you should not stay in the same room with you, or they should wear a facemask if they enter your room.  Cover your coughs and sneezes  Cover your mouth and nose with a tissue when you cough or sneeze. Throw used tissues in a lined trash   can. Immediately wash your hands with soap and water for at least 20 seconds or, if soap and water are not available, clean your hands with an alcohol-based hand sanitizer that contains at least 60% alcohol.  Clean your hands often  Wash your hands often with soap and water for at least 20 seconds, especially after blowing your nose, coughing, or sneezing; going to the bathroom; and  before eating or preparing food. If soap and water are not readily available, use an alcohol-based hand sanitizer with at least 60% alcohol, covering all surfaces of your hands and rubbing them together until they feel dry.  Soap and water are the best option if hands are visibly dirty. Avoid touching your eyes, nose, and mouth with unwashed hands.  Avoid sharing personal household items  You should not share dishes, drinking glasses, cups, eating utensils, towels, or bedding with other people or pets in your home. After using these items, they should be washed thoroughly with soap and water.  Clean all ???high-touch??? surfaces everyday  High touch surfaces include counters, tabletops, doorknobs, bathroom fixtures, toilets, phones, keyboards, tablets, and bedside tables. Also, clean any surfaces that may have blood, stool, or body fluids on them. Use a household cleaning spray or wipe, according to the label instructions. Labels contain instructions for safe and effective use of the cleaning product including precautions you should take when applying the product, such as wearing gloves and making sure you have good ventilation during use of the product.  Monitor your symptoms  Seek prompt medical attention if your illness is worsening (e.g., difficulty breathing). Before seeking care, call your healthcare provider and tell them that you have, or are being evaluated for, COVID-19. Put on a facemask before you enter the facility. These steps will help the healthcare provider???s office to keep other people in the office or waiting room from getting infected or exposed. Ask your healthcare provider to call the local or state health department. Persons who are placed under active monitoring or facilitated self-monitoring should follow instructions provided by their local health department or occupational health professionals, as appropriate. When working with your local health department check their available hours.  If you  have a medical emergency and need to call 911, notify the dispatch personnel that you have, or are being evaluated for COVID-19. If possible, put on a facemask before emergency medical services arrive.  Discontinuing home isolation  Patients with confirmed COVID-19 should remain under home isolation precautions until the risk of secondary transmission to others is thought to be low. The decision to discontinue home isolation precautions should be made on a case-by-case basis, in consultation with healthcare providers and state and local health departments.      Thank you for enrolling in MyChart. Please follow the instructions below to securely access your online medical record. MyChart allows you to send messages to your doctor, view your test results, renew your prescriptions, schedule appointments, and more.     How Do I Sign Up?  1. In your Internet browser, go to [email or web address]  2. Click on the Sign Up Now link in the Sign In box. You will see the New Member Sign Up page.  3. Enter your MyChart Access Code exactly as it appears below. You will not need to use this code after you???ve completed the sign-up process. If you do not sign up before the expiration date, you must request a new code.  MyChart Access Code: Activation code not generated  Current MyChart Status:   Patient Declined    4. Enter your Social Security Number ([id number]) and Date of Birth (mm/dd/yyyy) as indicated and click Submit. You will be taken to the next sign-up page.  5. Create a MyChart ID. This will be your MyChart login ID and cannot be changed, so think of one that is secure and easy to remember.  6. Create a MyChart password. You can change your password at any time.  7. Enter your Password Reset Question and Answer. This can be used at a later time if you forget your password.   8. Enter your e-mail address. You will receive e-mail notification when new information is available in MyChart.  9. Click  Sign Up. You can now view your medical record.     Additional Information  If you have questions, please contact your physician practice where you receive care. Remember, MyChart is NOT to be used for urgent needs. For medical emergencies, dial 911.

## 2019-03-07 NOTE — Telephone Encounter (Signed)
CALLED AND SPOKE WITH PT HUSBAND AND ADVISED THAT SHE WILL NEED TO FOLLOW UP AND THAT IF HER SX GET WORSE TO GO TO THE ER. AND THAT THEY WILL CALL WITH THE RESULTS. HS

## 2019-03-07 NOTE — Progress Notes (Signed)
03/07/2019  Wendy Frank (DOB:  09-May-1981)    FLU/COVID-19 CLINIC EVALUATION  Chief Complaint   Patient presents with   ??? Fever   ??? Cough       HPI: employer [employer], + exposure at work    SYMPTOMS:    Symptom duration, days:  [x]  1   []  2   []  3   []  4 - 7   []  8 - 10   []  11 - 13   []  >14    [x]  Fevers    []  Symptom (not measured)  [x]  Measured (Result:99  degrees)  []  Chills  [x]  Cough dry  []  Coughing up blood  }  []  Chest Congestion  []  Nasal Congestion  []  Sneezing  [x]  Feeling short of breath  [x]  Sometimes  []  Frequently   []  All the time     []  Chest pain     []  Headaches  [] Tolerable  []  Severe     []  Sore throat  [x]  Muscle aches  []  Nausea  []  Vomiting  [] Unable to keep fluids down     [x]  Diarrhea  [] Severe       []  OTHER SYMPTOMS:      Symptom course:   []  Worsening     [x]  Stable     []  Improving    RISK FACTORS:1}  []  Pregnant or possibly pregnant  []  Age over 44  [x]  Diabetes metformin  []  Heart disease  []  Asthma  []  COPD/Other chronic lung diseases  []  Active Cancer  []  On Chemotherapy  []  Taking oral steroids  []  History Lymphoma/Leukemia  [x]  Close contact with a lab confirmed COVID-19 patient within 14 days of symptom onset (brother)  []  History of travel from affected geographical areas within 14 days of symptom onset     SOCIAL HISTORY:  Number of people living in patient's home (counting the patient as 1):  []  1   []  2   []  3   []  4   []  5   [x]  >6      PHYSICAL EXAMINATION:    Vitals:    03/07/19 1039   Pulse: 93   Temp: 98.6 ??F (37 ??C)   SpO2: 99%        INSTRUCTIONS:  "[x] " Indicates a positive item  "[] " Indicates a negative item    DELETE ALL ITEMS NOT EXAMINED    [x]  Alert  [x]  Oriented to person/place/time    [x]  No apparent distress  []  Toxic appearing    []  Face flushed appearing []  Sclera clear  []  Lips are cyanotic      [x]  Breathing appears normal  []  Appears tachypneic      []  Rash on visible skin    []  Cranial Nerves II-XII grossly intact    [x]  Motor grossly intact  in visible upper extremities    []  Motor grossly intact in visible lower extremities    [x]  Normal Mood  []  Anxious appearing    []  Depressed appearing  []  Confused appearing      []  Poor short term memory  []  Poor long term memory    []  OTHER:  1}    TESTS ORDERED:    []  POCT FLU  []  POCT STREP  [x]  COVID-19 Test sent    TEST RESULTS:    POCT FLU test:  []  Positive  []  Negative  POCT STREP test:  []  Positive  []  Negative  COVID-19 test:  []  Positive  []   Negative    ASSESSMENT:    []  Flu  []  Strep Throat  []  Uncertain Viral Respiratory Infection  [x]  Possible COVID-19    PLAN:    [x]  Discharge home with written instructions for:  []  Flu management  []  Strep throat management  []  Viral respiratory illness management  [x]  Possible COVID-19 infection with self-quarantine and management of symptoms  [x]  Follow-up with primary care physician or emergency department if worsens  []  Referred to emergency department for evaluation      An  electronic signature was used to authenticate this note.     --Galen Manilaebecca S Marvel Sapp, MD on 03/07/2019 at 4:18 PM

## 2019-03-07 NOTE — Telephone Encounter (Signed)
Your patient was seen at the [facility] today.  A follow up virtual visit with the patient's PCP should be completed in 48 hours.  Please schedule the patient for this follow-up.  Thank you.

## 2019-03-09 LAB — COVID-19 AMBULATORY: SARS-CoV-2: NOT DETECTED

## 2019-03-10 NOTE — Other (Signed)
Please contact patient with their testing results:    Your test for COVID-19, also known as novel coronavirus, came back negative. No virus was detected from the sample from your nose.    Until your symptoms are fully resolved, you may still be contagious.     We recommend that you remain isolated for 7 days minimum or 72 hours after your symptoms have completely resolved, whichever is longer. For example, if all symptoms improve after 2 days, you should still remain isolated for 7 days. If your symptoms get better after 10 days, you should remain isolated for 13 days.     Continually monitor symptoms. Contact a medical provider if symptoms are worsening.     If you have any additional questions, contact your doctor.    For additional information, please visit the Centers for Disease Control and Prevention (https://www.cdc.gov/coronavirus/2019-ncov/index.html#

## 2019-10-06 NOTE — Telephone Encounter (Signed)
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

## 2019-10-17 NOTE — Telephone Encounter (Signed)
CALLED AND ADVISED 12/22 0740 NO INTERPRETER NEEDED.KW

## 2019-10-28 ENCOUNTER — Ambulatory Visit: Admit: 2019-10-28 | Discharge: 2019-10-28 | Payer: BLUE CROSS/BLUE SHIELD | Attending: Family | Primary: Family Medicine

## 2019-10-28 DIAGNOSIS — Z0000 Encounter for general adult medical examination without abnormal findings: Secondary | ICD-10-CM

## 2019-10-28 LAB — COMPREHENSIVE METABOLIC PANEL
ALT: 10 U/L (ref 10–40)
AST: 13 U/L — ABNORMAL LOW (ref 15–37)
Albumin/Globulin Ratio: 1.5 (ref 1.1–2.2)
Albumin: 4.1 g/dL (ref 3.4–5.0)
Alkaline Phosphatase: 71 U/L (ref 40–129)
Anion Gap: 14 (ref 3–16)
BUN: 9 mg/dL (ref 7–20)
CO2: 21 mmol/L (ref 21–32)
Calcium: 9.1 mg/dL (ref 8.3–10.6)
Chloride: 103 mmol/L (ref 99–110)
Creatinine: <0.5 mg/dL — ABNORMAL LOW (ref 0.6–1.1)
GFR African American: >60 (ref >60)
GFR Non-African American: >60 (ref >60)
Globulin: 2.8 g/dL
Glucose: 144 mg/dL — ABNORMAL HIGH (ref 70–99)
Potassium: 4.5 mmol/L (ref 3.5–5.1)
Sodium: 138 mmol/L (ref 136–145)
Total Bilirubin: 0.4 mg/dL (ref 0.0–1.0)
Total Protein: 6.9 g/dL (ref 6.4–8.2)

## 2019-10-28 LAB — POCT MICROALBUMIN

## 2019-10-28 LAB — LIPID, FASTING
Cholesterol, Fasting: 181 mg/dL (ref 0–199)
HDL: 45 mg/dL (ref 40–60)
LDL Calculated: 102 mg/dL — ABNORMAL HIGH (ref <100)
Triglyceride, Fasting: 169 mg/dL — ABNORMAL HIGH (ref 0–150)
VLDL Cholesterol Calculated: 34 mg/dL

## 2019-10-28 MED ORDER — CETIRIZINE HCL 10 MG PO TABS
10 MG | ORAL_TABLET | Freq: Every day | ORAL | 0 refills | Status: DC
Start: 2019-10-28 — End: 2020-02-24

## 2019-10-28 MED ORDER — FAMOTIDINE 20 MG PO TABS
20 MG | ORAL_TABLET | Freq: Every day | ORAL | 3 refills | Status: DC
Start: 2019-10-28 — End: 2020-02-24

## 2019-10-29 LAB — HEMOGLOBIN A1C
Hemoglobin A1C: 6.5 %
eAG: 139.9 mg/dL

## 2020-01-27 ENCOUNTER — Encounter: Payer: BLUE CROSS/BLUE SHIELD | Attending: Family | Primary: Family Medicine

## 2020-02-17 ENCOUNTER — Encounter

## 2020-02-17 MED ORDER — METFORMIN HCL ER 500 MG PO TB24
500 MG | ORAL_TABLET | ORAL | 0 refills | Status: DC
Start: 2020-02-17 — End: 2020-05-13

## 2020-02-24 ENCOUNTER — Ambulatory Visit: Admit: 2020-02-24 | Discharge: 2020-02-24 | Payer: BLUE CROSS/BLUE SHIELD | Attending: Family | Primary: Family Medicine

## 2020-02-24 DIAGNOSIS — E119 Type 2 diabetes mellitus without complications: Secondary | ICD-10-CM

## 2020-02-24 LAB — POCT GLYCOSYLATED HEMOGLOBIN (HGB A1C): Hemoglobin A1C: 6.4 %

## 2020-02-24 MED ORDER — CETIRIZINE HCL 10 MG PO TABS
10 MG | ORAL_TABLET | Freq: Every day | ORAL | 3 refills | Status: AC
Start: 2020-02-24 — End: 2020-05-24

## 2020-02-24 MED ORDER — OMEPRAZOLE 20 MG PO CPDR
20 MG | ORAL_CAPSULE | Freq: Two times a day (BID) | ORAL | 3 refills | Status: DC
Start: 2020-02-24 — End: 2020-06-25

## 2020-05-13 ENCOUNTER — Encounter

## 2020-05-13 MED ORDER — METFORMIN HCL ER 500 MG PO TB24
500 MG | ORAL_TABLET | ORAL | 0 refills | Status: DC
Start: 2020-05-13 — End: 2020-08-16

## 2020-05-13 NOTE — Telephone Encounter (Signed)
Last OV 02/24/2020 CB  Next OV 05/25/2020 JDK

## 2020-05-13 NOTE — Telephone Encounter (Signed)
Hemoglobin A1C 6.4  % Final 02/24/2020 10:23 AM Unknown       REFILL SENT

## 2020-05-25 ENCOUNTER — Ambulatory Visit
Admit: 2020-05-25 | Discharge: 2020-05-25 | Payer: BLUE CROSS/BLUE SHIELD | Attending: Family Medicine | Primary: Family Medicine

## 2020-05-25 DIAGNOSIS — E119 Type 2 diabetes mellitus without complications: Secondary | ICD-10-CM

## 2020-05-25 LAB — POCT GLYCOSYLATED HEMOGLOBIN (HGB A1C): Hemoglobin A1C: 6.6 %

## 2020-06-25 ENCOUNTER — Encounter

## 2020-06-25 MED ORDER — OMEPRAZOLE 20 MG PO CPDR
20 MG | ORAL_CAPSULE | ORAL | 1 refills | Status: DC
Start: 2020-06-25 — End: 2021-03-29

## 2020-06-25 NOTE — Telephone Encounter (Signed)
Last visit 05/25/2020. Next visit none. Hs

## 2020-08-14 ENCOUNTER — Encounter

## 2020-08-16 MED ORDER — METFORMIN HCL ER 500 MG PO TB24
500 MG | ORAL_TABLET | ORAL | 0 refills | Status: DC
Start: 2020-08-16 — End: 2020-11-08

## 2020-08-16 NOTE — Telephone Encounter (Signed)
Called and spoke to patient husband. Medication was called in this am. sc

## 2020-08-16 NOTE — Telephone Encounter (Signed)
LAST APT 05/25/20 WITH KERBO NO APT SCHEDULED. MYCHART SENT SHE IS DUE THIS MONTH

## 2020-08-16 NOTE — Telephone Encounter (Signed)
Patients husband called to get a refill on sarawati's metformin      [id number] - [place], [place] - [address] - P [phone] - F [phone]

## 2020-08-28 NOTE — Telephone Encounter (Signed)
Call patient's spouse to schedule visit for future for refills.  (Not sure how good patient's English is versus her spouse).

## 2020-08-30 NOTE — Telephone Encounter (Signed)
CALLED AND SPOKE TO HUSBAND. 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 SCHEDULED HER FOR 09/24/20  AT 3:30PM FOR 30 MINUTES VISIT. SC

## 2020-09-24 ENCOUNTER — Ambulatory Visit
Admit: 2020-09-24 | Discharge: 2020-10-03 | Payer: BLUE CROSS/BLUE SHIELD | Attending: Family Medicine | Primary: Family Medicine

## 2020-09-24 DIAGNOSIS — E119 Type 2 diabetes mellitus without complications: Secondary | ICD-10-CM

## 2020-09-24 LAB — POCT GLYCOSYLATED HEMOGLOBIN (HGB A1C): Hemoglobin A1C: 7.1 %

## 2020-09-24 NOTE — Patient Instructions (Addendum)
Wendy Frank was seen today for diabetes.    Diagnoses and all orders for this visit:    Type 2 diabetes mellitus without complication, without long-term current use of insulin (HCC)  -     POCT glycosylated hemoglobin (Hb A1C)  Exercise, weight loss and frequent small healthy balanced meals help prevent diabetes.  Weekly weights can help you track your progress.     Gastroesophageal reflux disease, unspecified whether esophagitis present  Frequent small meals help.     Vitamin D deficiency  You have low level of vitamin D.  This is associated with:  Increased risk of death from cardiovascular disease, cognitive impairment in older adults, severe asthma in children, cancer especially breast and colon.  Vitamin D may also have a role in treatment of diabetes and prediabetes, high blood pressure, psoriasis and multiple sclerosis.  Vitamin D deficiency weakens bones leading to rickets in children and osteoporosis in adults, increases risk of infections, and leads to more skin problems. The primary symptoms of deficiency are often muscle aches and weakness as well as fatigue often mistaken for natural aging.   Please start 4000 IU daily and recheck in 1 year.  (400 IU = 10 mcg, 1000 IU = 25 mcg, 4000 IU = 100 mcg, 5000 IU = 125 mcg)        Patient Education        ???????? ?????? ????? ?????????????? (?????) ?? ?????? ? ???? ???? ?????? ?????  Learning About Carbohydrate (Carb) Counting and Eating Out When You Have Diabetes  ????? ?????? ????? ??? ?????? ?????     ?????? ???????? ???????? ???????????? ????? ????? ????? ???????? ??? ???????? ??????????????, ????????????? ???? ? ???????????? ????? ?????? ??????? ??????? ????? ??????????? ????? ?????????? ????? ???? ?????? ?????? ???? ???? ????? ???????? ????? ????? ?????  ????? ??????? ??????? ??? ?????? ????? ?????????? ???????????? ????? ???????? ???? ???????? ??? ??????????? ?? ????? ??? ??? ????????? ? ????? ????? ??????? ??? ????????? ??? ??????? ????? ?????????  ?????  ???? ???????? ?? ???????? ???? ???????? ????? ??? ??????????? ?????? ???????? ???? ? ?????? ?????? ???????? ??? ? ?????? ??????? ?????? ??????? ??????????? ???? ??? ??????????? ???? ??????? ????????? ????? ?? ?? ??? ????????? ???????? ??? ???????? ???????? ????? ??? ????? ???? ???????????  ?????????????? ???? ?????? ??????? ?????? ????? ??????? ??-?? ?????  ???????? ?????? ????? ??????? ???? ?????????????? (carbs) ?? ????????? ?????????? ????? ???????? ????????? ???? ????? ???????????? ??? ??? ?????????????? ??????? ????????????? ???????   ??? ????????????? ?????????????? ?????? ????? ?????? ??????????: ? ??????? ????????????? ?????????????? ????? ????????? ???????????  ? ?????, ????, ?????? ? ????? ?? ???????? ???? 15 ????? ?????????????? ??????? ???? ???????? 1 ?????? ????? (1 ?????),  ?? ??????? ???? ?? 1/3 ?? ??????? ?????? ?? ??? ??????  ? ?????????? ?? ???????? 15 ????? ?????????????? ??????? ???? ?????? ????? 1 ???? ???? ????? ????? ????? ?? ??????? ?????; ?????? ??? ???; ??????? ?? ???????? ??????? ????? ??? ??; ??? ?? ??????? ???; 1 ???? ?? ????????; ?? 2 ?????????? ???????? ????? ?????  ? ??? ? ???? ??????? ????? ?? ???????? 15 ????? ?????????????? ??????? ?? ?????? ????? 1 ?? ??? ?? 2/3 ?? ???? ??????? ??? ???  ? ???????????? ????????????? ???????? 15 ????? ?????????????? ??????? ???? ???????? ??? ?? ?????? ?????? ?? ?????? ???; 1 ?? ?????? ???????; ??? ?? ????? ??? ?????? ???; ??? ?? ??????? ???????; ?? ??? ?? ??????? ??? ?? ????? ?????? ?????   ???????? ????? ???????? ?????? ??? ???????? ?????????????? ???? ???? ????? ??????????? ???? ???????? ?? CDE ?? ???????? ??????? ???? ???????????????? ????????? ???? ????? ?????? ??? ?????? ??????? ????? ???????????????? ?????? ???????   ????? ???????????????? ???????? ???? ?????? ????? ????? ?????? ??????? ????????? ???, ????????? ????? ??????? ???? ????? ???? ?????? ?????????? ???? ???? ??????? ???????? ???? ???????? ? ??? ??????? ?????? ???? ?????? ? ?????  ???? ??? ???? ???????? ????? ?????????????? ?????? ???????? ???? ??? ????? ??????  ? ????????????? ???????????? ????? ????? ?????? ?????????? ??????? ??? ????? ??????? ????? ???????????, ??????? ??-?????? ????? ???? ?? ???? ??????? ???? ???????? ? ??????? ?????? ?? ?????? ????? ????? ?? ???????????? ???????? ??????????? ???? ?????? ???? ??????? ?????? ??? ???????? ?????????????? ??? ?????????? ????? ?????? ?????? ???? ??????? ???? ?????? ?  1 ?? ??? ?? ??? ???? ???????????   ???????? ?????? ???? ???????? ?????????????? ???? ?????? ?????????? ?? ????? ???? ????? ??????? ????? ????? ?????????????? "????" ???????????   ????????? ????? ???????? ????? ???? ?????????????? ????? ?? ?????????????? ????? ???? ????? ????????? ??????????? ???, ????, ?????, ????, ????????, ????, ???, ???? ??? ? ?????? ??? ???????? ?????? ?? ???????? 3 ????? ????? ??? ???? ?????????? ????? ???? ????? ?????? ?????? ?????? ?????? ?????? (???????) ????????? ?????? ??????????? ???????? ????????? (1 ????? ???? ???????) ??????????? 1/4 ?? ???? ???, 1 ?????, 1 ?????????? ?????? ??? ?  ???? ????????  ??????? ???? ????? ????????? ? ???????? ???????? ?????????????   ??????????????? ???? ?????? ??????? ?????? ?????? ???? ??????????? ??? ??????? ???? ???? ???? ?????????? ???, ????????????? ?????? ??????? ?????? ?????? ???? ??? ??????   ??? ??????? ????? ????????? ?????? ?????? ?????????????? ???? (????? ???, ???, ?? ??????? ????), ?????? ??????? ?????-?????????????? ???? ???? ??????????? ???? ?? ????? ?????? ???????????   ??????? ?????????? ?????? ?????????? ??? ???????? ??? ???? ???????? ??? ????? ????? ????? ?????? ???? ????? ????????? ?? ?????? ????? ???? ???? ???? ??????????   ??????? ???????? ?????? ????? ????? ?????????????? ??? ?????????????? ????????, ??????????? ?? ???? ??????? ?????????? ???? ??????? ???? ???? ?? ???? ????? ???????  ???????? ???????? ???????????? ?????? ????? ??????? ??-?? ?????   ???? ? ????? ???????????? ???? ?????  ??????????? ????? ?????????? ?????? ???? ??????????? ?????? ??? ???? ???? ????? ???? ?????? ?? ???????? ????? ??? ???????? ???????? ??????? ???? ? ???? ????? ?? ?? ???? ???? ????? ?????????? ??????????? ???????? ??? ???? ?? ???????? ??? ?????? ????????? ?? ??????? ?? ?????? ??????????   ???? ??? ???????????? ??????? ???? ??? ????????? ? ???????? ???? ???????? ?? ??????? ??????? ???? ???????? ??? ??????? ???? ???? (????? ?????? ??????) ???? ?????   ????? ???? ???????? ??????? ??????????? ????? ???? ?????????? ??????? ??????? ???? ???? ??? ?? ?????? ????? ??????? ???????? ??????? ??????? ???? ????????? ? ??? ??????? ???? ??????????? ??? ?????????? ?????  ???-?? ?????? ??????? ????? ??? ????????? ????? ??? ??? ??? ????????????? ??? ??? ????? ??????? ????????? ???? ??? ???????? ????????? ??????? ? ??? ????? ????????? ?? ?????????? ????? ?????? ????????? ???? ????? ??? ????? ???? ????????? ???? ?????? ??? ?? ?????? ????? ???  ??????? ?? ???? ????? ???????????  ???? ????????   [email or web address]  ???????? ????????? I147 ?????? ?? ????? ??? ?????? "???????? ?????? ????? ?????????????? (?????) ?? ?????? ? ???? ???? ?????? ?????."  ?? ????? ???: 2020 12 17????????? ?????: 13.0   2006-2021 [employer]   ???????? ?????????????? ?????? ???????? ???????? ??????? ????????? ?????? ????? ??????? ??? ???????? ?? ???????? ?????? ?? ?? ?????????? ?????? ????????? ??? ???, ???? ????? ????????? ?????? ????? ????????? ??????????? [employer], ?? ??????? ?? ????????? ???????? ???? ???? ??? ???????? ?? ?????????? ???????? ??????         Patient Education        ???????? ?????? ????? ?????????????? (?????) ?? ?????? ? ???? ???? ?????? ?????  Learning About Carbohydrate (Carb) Counting and Eating Out When You Have Diabetes  ????? ?????? ????? ??? ?????? ?????     ?????? ???????? ???????? ???????????? ????? ????? ????? ???????? ??? ???????? ??????????????, ????????????? ???? ?  ???????????? ????? ?????? ??????? ??????? ????? ??????????? ????? ?????????? ????? ???? ?????? ?????? ???? ???? ????? ???????? ????? ????? ?????  ????? ??????? ??????? ??? ?????? ????? ?????????? ???????????? ????? ???????? ???? ???????? ??? ??????????? ?? ????? ??? ??? ????????? ? ????? ????? ??????? ??? ????????? ??? ??????? ????? ?????????  ????? ???? ???????? ?? ???????? ???? ???????? ????? ??? ??????????? ?????? ???????? ???? ? ?????? ?????? ???????? ??? ? ?????? ??????? ?????? ??????? ??????????? ???? ??? ??????????? ???? ??????? ????????? ????? ?? ?? ??? ????????? ???????? ??? ???????? ???????? ????? ??? ????? ???? ???????????  ?????????????? ???? ?????? ??????? ?????? ????? ??????? ??-?? ?????  ???????? ?????? ????? ??????? ???? ?????????????? (  carbs) ?? ????????? ?????????? ????? ???????? ????????? ???? ????? ???????????? ??? ??? ?????????????? ??????? ????????????? ???????   ??? ????????????? ?????????????? ?????? ????? ?????? ??????????: ? ??????? ????????????? ?????????????? ????? ????????? ???????????  ? ?????, ????, ?????? ? ????? ?? ???????? ???? 15 ????? ?????????????? ??????? ???? ???????? 1 ?????? ????? (1 ?????),  ?? ??????? ???? ?? 1/3 ?? ??????? ?????? ?? ??? ??????  ? ?????????? ?? ???????? 15 ????? ?????????????? ??????? ???? ?????? ????? 1 ???? ???? ????? ????? ????? ?? ??????? ?????; ?????? ??? ???; ??????? ?? ???????? ??????? ????? ??? ??; ??? ?? ??????? ???; 1 ???? ?? ????????; ?? 2 ?????????? ???????? ????? ?????  ? ??? ? ???? ??????? ????? ?? ???????? 15 ????? ?????????????? ??????? ?? ?????? ????? 1 ?? ??? ?? 2/3 ?? ???? ??????? ??? ???  ? ???????????? ????????????? ???????? 15 ????? ?????????????? ??????? ???? ???????? ??? ?? ?????? ?????? ?? ?????? ???; 1 ?? ?????? ???????; ??? ?? ????? ??? ?????? ???; ??? ?? ??????? ???????; ?? ??? ?? ??????? ??? ?? ????? ?????? ?????   ???????? ????? ???????? ?????? ??? ???????? ?????????????? ???? ???? ????? ??????????? ???? ???????? ?? CDE ??  ???????? ??????? ???? ???????????????? ????????? ???? ????? ?????? ??? ?????? ??????? ????? ???????????????? ?????? ???????   ????? ???????????????? ???????? ???? ?????? ????? ????? ?????? ??????? ????????? ???, ????????? ????? ??????? ???? ????? ???? ?????? ?????????? ???? ???? ??????? ???????? ???? ???????? ? ??? ??????? ?????? ???? ?????? ? ????? ???? ??? ???? ???????? ????? ?????????????? ?????? ???????? ???? ??? ????? ??????  ? ????????????? ???????????? ????? ????? ?????? ?????????? ??????? ??? ????? ??????? ????? ???????????, ??????? ??-?????? ????? ???? ?? ???? ??????? ???? ???????? ? ??????? ?????? ?? ?????? ????? ????? ?? ???????????? ???????? ??????????? ???? ?????? ???? ??????? ?????? ??? ???????? ?????????????? ??? ?????????? ????? ?????? ?????? ???? ??????? ???? ?????? ? 1 ?? ??? ?? ??? ???? ???????????   ???????? ?????? ???? ???????? ?????????????? ???? ?????? ?????????? ?? ????? ???? ????? ??????? ????? ????? ?????????????? "????" ???????????   ????????? ????? ???????? ????? ???? ?????????????? ????? ?? ?????????????? ????? ???? ????? ????????? ??????????? ???, ????, ?????, ????, ????????, ????, ???, ???? ??? ? ?????? ??? ???????? ?????? ?? ???????? 3 ????? ????? ??? ???? ?????????? ????? ???? ????? ?????? ?????? ?????? ?????? ?????? (???????) ????????? ?????? ??????????? ???????? ????????? (1 ????? ???? ???????) ??????????? 1/4 ?? ???? ???, 1 ?????, 1 ?????????? ?????? ??? ?  ???? ????????  ??????? ???? ????? ????????? ? ???????? ???????? ?????????????   ??????????????? ???? ?????? ??????? ?????? ?????? ???? ??????????? ??? ??????? ???? ???? ???? ?????????? ???, ????????????? ?????? ??????? ?????? ?????? ???? ??? ??????   ??? ??????? ????? ????????? ?????? ?????? ?????????????? ???? (????? ???, ???, ?? ??????? ????), ?????? ??????? ?????-?????????????? ???? ???? ??????????? ???? ?? ????? ?????? ???????????   ??????? ?????????? ?????? ?????????? ??? ???????? ??? ???? ???????? ??? ????? ?????  ????? ?????? ???? ????? ????????? ?? ?????? ????? ???? ???? ???? ??????????   ??????? ???????? ?????? ????? ????? ?????????????? ??? ?????????????? ????????, ??????????? ?? ???? ??????? ?????????? ???? ??????? ???? ???? ?? ???? ????? ???????  ???????? ???????? ???????????? ?????? ????? ??????? ??-?? ?????   ???? ? ????? ???????????? ???? ????? ??????????? ????? ?????????? ?????? ???? ??????????? ?????? ??? ???? ???? ????? ???? ?????? ?? ???????? ????? ??? ???????? ???????? ??????? ???? ? ???? ????? ?? ?? ???? ???? ????? ?????????? ??????????? ???????? ??? ???? ?? ???????? ??? ?????? ????????? ?? ??????? ?? ?????? ??????????   ???? ??? ???????????? ??????? ???? ??? ????????? ? ???????? ???? ???????? ?? ??????? ??????? ???? ???????? ??? ??????? ???? ???? (????? ?????? ??????) ???? ?????   ????? ???? ???????? ??????? ??????????? ????? ???? ?????????? ??????? ??????? ???? ???? ??? ?? ?????? ????? ??????? ???????? ??????? ??????? ???? ????????? ? ??? ??????? ???? ??????????? ??? ?????????? ?????  ???-?? ?????? ??????? ????? ??? ????????? ????? ??? ??? ??? ????????????? ??? ??? ????? ??????? ????????? ???? ??? ???????? ????????? ??????? ? ??? ????? ????????? ?? ?????????? ????? ?????? ????????? ???? ????? ??? ????? ???? ????????? ???? ?????? ??? ?? ?????? ????? ???  ??????? ?? ???? ????? ???????????  ???? ????????  [email or web address]  ???????? ????????? I147 ?????? ?? ????? ??? ?????? "???????? ?????? ????? ?????????????? (?????) ?? ?????? ? ???? ???? ?????? ?????."  ?? ????? ???: 2020 12 17????????? ?????: 13.0   2006-2021 [employer]   ???????? ?????????????? ?????? ???????? ???????? ??????? ????????? ?????? ????? ??????? ??? ???????? ?? ???????? ?????? ?? ?? ?????????? ?????? ????????? ??? ???, ???? ????? ????????? ?????? ????? ????????? ??????????? [employer], ?? ??????? ?? ????????? ???????? ???? ???? ??? ???????? ?? ?????????? ???????? ??????

## 2020-09-24 NOTE — Progress Notes (Signed)
Wendy Frank (DOB:  1980/11/12) is a 38 y.o. female,Established patient, here for evaluation of the following chief complaint(s):  Diabetes (FOLLOW UP ON DIABETES, A1C DUE TODAY)       ASSESSMENT/PLAN:  443    Patient Instructions   Wendy Frank was seen today for diabetes.    Diagnoses and all orders for this visit:    Type 2 diabetes mellitus without complication, without long-term current use of insulin (HCC)  -     POCT glycosylated hemoglobin (Hb A1C)  Exercise, weight loss and frequent small healthy balanced meals help prevent diabetes.  Weekly weights can help you track your progress.     Gastroesophageal reflux disease, unspecified whether esophagitis present  Frequent small meals help.     Vitamin D deficiency  You have low level of vitamin D.  This is associated with:  Increased risk of death from cardiovascular disease, cognitive impairment in older adults, severe asthma in children, cancer especially breast and colon.  Vitamin D may also have a role in treatment of diabetes and prediabetes, high blood pressure, psoriasis and multiple sclerosis.  Vitamin D deficiency weakens bones leading to rickets in children and osteoporosis in adults, increases risk of infections, and leads to more skin problems. The primary symptoms of deficiency are often muscle aches and weakness as well as fatigue often mistaken for natural aging.   Please start 4000 IU daily and recheck in 1 year.  (400 IU = 10 mcg, 1000 IU = 25 mcg, 4000 IU = 100 mcg, 5000 IU = 125 mcg)    Patient Education   ???????? ?????? ????? ?????????????? (?????) ?? ?????? ? ???? ???? ?????? ?????  Learning About Carbohydrate (Carb) Counting and Eating Out When You Have Diabetes  ????? ?????? ????? ??? ?????? ?????     ?????? ???????? ???????? ???????????? ????? ????? ????? ???????? ??? ???????? ??????????????, ????????????? ???? ? ???????????? ????? ?????? ??????? ??????? ????? ??????????? ????? ?????????? ????? ???? ?????? ?????? ???? ???? ?????  ???????? ????? ????? ?????  ????? ??????? ??????? ??? ?????? ????? ?????????? ???????????? ????? ???????? ???? ???????? ??? ??????????? ?? ????? ??? ??? ????????? ? ????? ????? ??????? ??? ????????? ??? ??????? ????? ?????????  ????? ???? ???????? ?? ???????? ???? ???????? ????? ??? ??????????? ?????? ???????? ???? ? ?????? ?????? ???????? ??? ? ?????? ??????? ?????? ??????? ??????????? ???? ??? ??????????? ???? ??????? ????????? ????? ?? ?? ??? ????????? ???????? ??? ???????? ???????? ????? ??? ????? ???? ???????????  ?????????????? ???? ?????? ??????? ?????? ????? ??????? ??-?? ?????  ???????? ?????? ????? ??????? ???? ?????????????? (carbs) ?? ????????? ?????????? ????? ???????? ????????? ???? ????? ???????????? ??? ??? ?????????????? ??????? ????????????? ???????   ??? ????????????? ?????????????? ?????? ????? ?????? ??????????: ? ??????? ????????????? ?????????????? ????? ????????? ???????????  ? ?????, ????, ?????? ? ????? ?? ???????? ???? 15 ????? ?????????????? ??????? ???? ???????? 1 ?????? ????? (1 ?????),  ?? ??????? ???? ?? 1/3 ?? ??????? ?????? ?? ??? ??????  ? ?????????? ?? ???????? 15 ????? ?????????????? ??????? ???? ?????? ????? 1 ???? ???? ????? ????? ????? ?? ??????? ?????; ?????? ??? ???; ??????? ?? ???????? ??????? ????? ??? ??; ??? ?? ??????? ???; 1 ???? ?? ????????; ?? 2 ?????????? ???????? ????? ?????  ? ??? ? ???? ??????? ????? ?? ???????? 15 ????? ?????????????? ??????? ?? ?????? ????? 1 ?? ??? ?? 2/3 ?? ???? ??????? ??? ???  ? ???????????? ????????????? ???????? 15 ????? ?????????????? ??????? ???? ???????? ??? ?? ?????? ?????? ?? ?????? ???; 1 ?? ?????? ???????; ??? ?? ????? ??? ?????? ???; ??? ?? ??????? ???????; ?? ??? ?? ??????? ??? ?? ????? ?????? ?????   ???????? ????? ???????? ?????? ??? ???????? ?????????????? ???? ???? ????? ??????????? ???? ???????? ??  CDE ?? ???????? ??????? ???? ???????????????? ????????? ???? ????? ?????? ??? ?????? ??????? ????? ???????????????? ??????  ???????   ????? ???????????????? ???????? ???? ?????? ????? ????? ?????? ??????? ????????? ???, ????????? ????? ??????? ???? ????? ???? ?????? ?????????? ???? ???? ??????? ???????? ???? ???????? ? ??? ??????? ?????? ???? ?????? ? ????? ???? ??? ???? ???????? ????? ?????????????? ?????? ???????? ???? ??? ????? ??????  ? ????????????? ???????????? ????? ????? ?????? ?????????? ??????? ??? ????? ??????? ????? ???????????, ??????? ??-?????? ????? ???? ?? ???? ??????? ???? ???????? ? ??????? ?????? ?? ?????? ????? ????? ?? ???????????? ???????? ??????????? ???? ?????? ???? ??????? ?????? ??? ???????? ?????????????? ??? ?????????? ????? ?????? ?????? ???? ??????? ???? ?????? ? 1 ?? ??? ?? ??? ???? ???????????   ???????? ?????? ???? ???????? ?????????????? ???? ?????? ?????????? ?? ????? ???? ????? ??????? ????? ????? ?????????????? "????" ???????????   ????????? ????? ???????? ????? ???? ?????????????? ????? ?? ?????????????? ????? ???? ????? ????????? ??????????? ???, ????, ?????, ????, ????????, ????, ???, ???? ??? ? ?????? ??? ???????? ?????? ?? ???????? 3 ????? ????? ??? ???? ?????????? ????? ???? ????? ?????? ?????? ?????? ?????? ?????? (???????) ????????? ?????? ??????????? ???????? ????????? (1 ????? ???? ???????) ??????????? 1/4 ?? ???? ???, 1 ?????, 1 ?????????? ?????? ??? ?  ???? ????????  ??????? ???? ????? ????????? ? ???????? ???????? ?????????????   ??????????????? ???? ?????? ??????? ?????? ?????? ???? ??????????? ??? ??????? ???? ???? ???? ?????????? ???, ????????????? ?????? ??????? ?????? ?????? ???? ??? ??????   ??? ??????? ????? ????????? ?????? ?????? ?????????????? ???? (????? ???, ???, ?? ??????? ????), ?????? ??????? ?????-?????????????? ???? ???? ??????????? ???? ?? ????? ?????? ???????????   ??????? ?????????? ?????? ?????????? ??? ???????? ??? ???? ???????? ??? ????? ????? ????? ?????? ???? ????? ????????? ?? ?????? ????? ???? ???? ???? ??????????   ??????? ???????? ?????? ????? ?????  ?????????????? ??? ?????????????? ????????, ??????????? ?? ???? ??????? ?????????? ???? ??????? ???? ???? ?? ???? ????? ???????  ???????? ???????? ???????????? ?????? ????? ??????? ??-?? ?????   ???? ? ????? ???????????? ???? ????? ??????????? ????? ?????????? ?????? ???? ??????????? ?????? ??? ???? ???? ????? ???? ?????? ?? ???????? ????? ??? ???????? ???????? ??????? ???? ? ???? ????? ?? ?? ???? ???? ????? ?????????? ??????????? ???????? ??? ???? ?? ???????? ??? ?????? ????????? ?? ??????? ?? ?????? ??????????   ???? ??? ???????????? ??????? ???? ??? ????????? ? ???????? ???? ???????? ?? ??????? ??????? ???? ???????? ??? ??????? ???? ???? (????? ?????? ??????) ???? ?????   ????? ???? ???????? ??????? ??????????? ????? ???? ?????????? ??????? ??????? ???? ???? ??? ?? ?????? ????? ??????? ???????? ??????? ??????? ???? ????????? ? ??? ??????? ???? ??????????? ??? ?????????? ?????  ???-?? ?????? ??????? ????? ??? ????????? ????? ??? ??? ??? ????????????? ??? ??? ????? ??????? ????????? ???? ??? ???????? ????????? ??????? ? ??? ????? ????????? ?? ?????????? ????? ?????? ????????? ???? ????? ??? ????? ???? ????????? ???? ?????? ??? ?? ?????? ????? ???  ??????? ?? ???? ????? ???????????  ???? ????????   [email or web address]  ???????? ????????? I147 ?????? ?? ????? ??? ?????? "???????? ?????? ????? ?????????????? (?????) ?? ?????? ? ???? ???? ?????? ?????."  ?? ????? ???: 2020 12 17????????? ?????: 13.0   2006-2021 [employer].   ???????? ?????????????? ?????? ???????? ???????? ??????? ????????? ?????? ????? ??????? ??? ???????? ?? ???????? ?????? ?? ?? ?????????? ?????? ????????? ??? ???, ???? ????? ????????? ?????? ????? ????????? ??????????? [employer], ?? ??????? ?? ????????? ???????? ???? ???? ??? ???????? ?? ?????????? ???????? ??????    No follow-ups on file.     Subjective   SUBJECTIVE/OBJECTIVE:  Chief Complaint   Patient presents with   . Diabetes      FOLLOW UP ON DIABETES, A1C DUE TODAY   408  HPI     Type 2 diabetes mellitus without complication, without long-term current use of insulin (HCC)  -     POCT glycosylated hemoglobin (Hb A1C)  Is checking Bs 1-2 day. Has a slightly easier job now. Has lost weight.   Gastroesophageal reflux disease, unspecified whether esophagitis present  Is having sx but pharmacies said has no refills. Is not taking NSAIDs. No black tarry stools.   Vitamin D deficiency  She has back pain in winter.  Not on vit D.    UTI/kidney infection  Txed in ER and all better now. This was her 1st UTI. Discussed urination after intercourse and DM control.  Medications at Time of Discharge  - documented as of this encounter    Medication Sig Dispensed Refills Start Date End Date   metFORMIN (GLUCOPHAGE) 500 MG TABS  Take 500 mg by mouth 2 (two) times daily with meals.  0     ibuprofen (ADVIL,MOTRIN) 800 MG TABS  Take 1 tablet by mouth every 8 (eight) hours as needed. 21 tablet  0 08/17/2020    phenazopyridine (PYRIDIUM) 200 MG TABS  Take 1 tablet by mouth 3 (three) times daily as needed (painful urination) for up to 2 days. 6 tablet  0 08/17/2020 08/19/2020   cefUROXime (CEFTIN) 250 mg tablet  Take 1 tablet by mouth 2 (two) times daily for 10 days. 20 tablet  0 08/17/2020 08/27/2020     Ordered Prescriptions  - documented in this encounter    Prescription Sig Dispensed Refills Start Date End Date   ibuprofen (ADVIL,MOTRIN) 800 MG TABS  Take 1 tablet by mouth every 8 (eight) hours as needed. 21 tablet  0 08/17/2020    cefUROXime (CEFTIN) 250 mg tablet  Take 1 tablet by mouth 2 (two) times daily for 10 days. 20 tablet  0 08/17/2020 08/27/2020   phenazopyridine (PYRIDIUM) 200 MG TABS  Take 1 tablet by mouth 3 (three) times daily as needed (painful urination) for up to 2 days.          Review of Systems     Objective   Physical Exam  Vitals and nursing note reviewed.   Constitutional:       General: She is not in acute distress.      Appearance: She is well-developed. She is not diaphoretic.   Eyes:      General: No scleral icterus.        Right eye: No discharge.         Left eye: No discharge.      Conjunctiva/sclera: Conjunctivae normal.   Neck:      Thyroid: No thyroid mass or thyromegaly.      Vascular: No carotid bruit or JVD.      Trachea: Trachea and phonation normal. No tracheal deviation.   Cardiovascular:      Rate and Rhythm: Normal rate and regular rhythm.  No extrasystoles are present.     Heart sounds: Normal heart sounds, S1 normal and S2 normal. Heart sounds not distant. No murmur heard.  No friction rub. No gallop. No S3 or S4 sounds.    Pulmonary:      Effort: Pulmonary effort is normal. No respiratory distress.      Breath sounds: Normal breath sounds. No decreased breath sounds, wheezing, rhonchi or rales.   Abdominal:  General: Abdomen is flat. There is no distension.      Palpations: Abdomen is soft. There is no hepatomegaly or splenomegaly.      Tenderness: There is abdominal tenderness in the epigastric area. There is no right CVA tenderness, left CVA tenderness or guarding. Negative signs include Murphy's sign and McBurney's sign.   Musculoskeletal:      Cervical back: Neck supple.   Lymphadenopathy:      Head:      Right side of head: No submandibular or tonsillar adenopathy.      Left side of head: No submandibular or tonsillar adenopathy.      Cervical: No cervical adenopathy.      Right cervical: No superficial, deep or posterior cervical adenopathy.     Left cervical: No superficial, deep or posterior cervical adenopathy.   Skin:     General: Skin is warm and dry.      Coloration: Skin is not pale.      Nails: There is no clubbing.   Neurological:      Mental Status: She is alert.      Deep Tendon Reflexes:      Reflex Scores:       Patellar reflexes are 2+ on the right side and 2+ on the left side.  Psychiatric:         Speech: Speech normal.         Behavior: Behavior normal.         Judgment: Judgment  normal.        Vitals:    09/24/20 1531   BP: 120/80   Site: Right Upper Arm   Position: Sitting   Cuff Size: Medium Adult   Pulse: 85   Resp: 20   SpO2: 98%   Weight: 142 lb (64.4 kg)   Height: 5' 1.5" (1.562 m)     BP Readings from Last 3 Encounters:   09/24/20 120/80   05/25/20 126/84   02/24/20 98/62     Pulse Readings from Last 3 Encounters:   09/24/20 85   05/25/20 68   02/24/20 98     Wt Readings from Last 3 Encounters:   09/24/20 142 lb (64.4 kg)   05/25/20 148 lb 12.8 oz (67.5 kg)   02/24/20 144 lb (65.3 kg)     Body mass index is 26.4 kg/m.  CMP (BMP,TP,ALB,TBIL,ALK,AST,ALT)  Specimen:  Whole blood   Ref Range & Units 1 mo ago Comments   SODIUM 135 - 145 mEq/L 137     POTASSIUM 3.6 - 5.1 mEq/L 3.8     CHLORIDE 98 - 111 mEq/L 105     CO2 21 - 31 mmol/L 24     GLUCOSE, RANDOM 70 - 99 mg/dL 128High     BLD UREA NITROGEN 8 - 26 mg/dL 9     CREATININE 0.60 - 1.20 mg/dL 0.61     CALCIUM 8.5 - 10.4 mg/dL 9.0     TOTAL PROTEIN 6.0 - 8.0 g/dL 6.8     ALBUMIN 3.5 - 5.7 g/dL 4.1     TOTAL BILIRUBIN 0.0 - 1.2 mg/dL 0.5     ALK PHOSPHATASE 35 - 135 IU/L 72     AST 10 - 40 IU/L 19     ALT 10 - 60 IU/L 23     GFR NON-AFRICAN AMER >59 mL/min/1.73 m2 115       Specimen:  Urine - Urine, Clean Catch  Component 1 mo ago   SPECIMEN DESCRIPTION  Urine, Clean Catch    SPECIMEN DESCRIPTION  Tested at [place] [address]    CULTURE RESULTS  Positive Growth    CULTURE RESULTS  STAPHYLOCOCCUS SAPROPHYTICUS    MICRONOTE  (NOTE) >100000 CFU/mL Staphylococcus saprophyticus Staphylococcus saprophyticus typically responds to urine concentrations of nitrofurantoin, trimethoprim/sulfa, or a fluoroquinolone commonly used to treat acute, uncomplicated urinary tract infections No   further workup. Tested at: [facility], [address] [address]   REPORT STATUS  08/19/2020 FINAL REPORT      CBC with Differential  Specimen:  Whole blood   Ref Range & Units 1 mo ago Comments   WBC 3.6 - 10.5  THOU/mcL 8.3     RBC 3.80 - 5.20 MIL/mcL 4.85     HEMOGLOBIN 12.0 - 15.2 g/dL 11.6Low     HEMATOCRIT 36 - 46 % 36.8     MCV 82 - 97 fL 75.9Low     MCH 27 - 33 pg 24.0Low     MCHC 32 - 36 g/dL 31.6Low     RDW 12.3 - 17.0 % 14.3     PLATELET 140 - 375 THOU/mcL 216     MPV 7.4 - 11.5 fL 9.2     ABS. NEUTROPHIL 1.80 - 7.70 THOU/mcL 6.40     ABS LYMPHS 1.00 - 4.00 THOU/mcL 1.50     ABS MONOS 0.20 - 0.90 THOU/mcL 0.40     ABS EOS 0.03 - 0.45 THOU/mcL 0.10     ABS BASOS 0.00 - 0.20 THOU/mcL 0.00     SEGS % 77     LYMPHOCYTES % 18     MONOCYTES % 4     EOSINOPHIL % 1     BASOPHILS % 0       [facility]        Contains abnormal dataCMP (BMP,TP,ALB,TBIL,ALK,AST,ALT)  Specimen:  Whole blood   Ref Range & Units 1 mo ago Comments   SODIUM 135 - 145 mEq/L 137     POTASSIUM 3.6 - 5.1 mEq/L 3.8     CHLORIDE 98 - 111 mEq/L 105     CO2 21 - 31 mmol/L 24     GLUCOSE, RANDOM 70 - 99 mg/dL 128High     BLD UREA NITROGEN 8 - 26 mg/dL 9     CREATININE 0.60 - 1.20 mg/dL 0.61     CALCIUM 8.5 - 10.4 mg/dL 9.0     TOTAL PROTEIN 6.0 - 8.0 g/dL 6.8     ALBUMIN 3.5 - 5.7 g/dL 4.1     TOTAL BILIRUBIN 0.0 - 1.2 mg/dL 0.5     ALK PHOSPHATASE 35 - 135 IU/L 72     AST 10 - 40 IU/L 19     ALT 10 - 60 IU/L 23     GFR NON-AFRICAN AMER >59 mL/min/1.73 m2 115     GFR AFRICAN AMERICAN >59 mL/min/1.73 m2 132        Ref. Range 08/23/2017 10:06 02/08/2018 16:21 08/08/2018 16:00 02/03/2019 09:08 10/28/2019 08:30 02/24/2020 10:23 05/25/2020 14:25   Hemoglobin A1C Latest Units: % 6.6 6.5 6.6 6.4 6.5 6.4 6.6     Results for POC orders placed in visit on 09/24/20   POCT glycosylated hemoglobin (Hb A1C)   Result Value Ref Range    Hemoglobin A1C 7.1 %     Lipase   Ref Range & Units 1 mo ago Comments   LIPASE 11 - 82 U/L [facility]  Resulting Agency  [place]    Specimen Collected: 08/17/20 9:39 AM     Urinalysis with Reflex to Microscopic and Culture   Ref Range & Units 1 mo ago Resulting Agency Comments   UA SPECIMEN SOURCE  Urine,  Clean Catch  [place] RECEIVING    URINE TYPE  Urine  [place] RECEIVING    COLOR Yellow DARK ORANGEAbnormal  [place]    APPEARANCE, URINE Clear TURBIDAbnormal  [place]    PH, URINE 5.0 - 8.0 6.0  [place]    SPEC. GRAVITY, URINE 1.005 - 1.029 1.011  [place]    PROTEIN, URINE NEGATIVE 1+ (30-70 mg/dL)Abnormal  [place]    GLUCOSE, URINE NEGATIVE NEGATIVE  [place]    KETONE, URINE NEGATIVE NEGATIVE  [place]    BILIRUBIN, URINE NEGATIVE POSITIVEAbnormal  [place] 1  Unable to rule out interfering substances that yield false-positive    BLOOD, URINE NEGATIVE 3+ (>=1.0 mg/dL)Abnormal  [place]    UROBILINOGEN, URINE NORMAL mg/dL 2+ (4-6 mg/dL)Abnormal  [place]    NITRITE, URINE NEGATIVE POSITIVEAbnormal  [place]    LEUKOCYTE ESTERASE, URINE NEGATIVE 4+ (500 Leu/uL)Abnormal  [place]    RBC, URINE <6 /HPF >50Abnormal  [place]    WBC, URINE 0 to 5 /HPF >50Abnormal  [place]    SQUAMOUS EPI CELLS /HPF 0 to 5  [place]    BACTERIA NONE /HPF [facility]    WBC CLUMPS /HPF PRESENT  [place]    MUCUS  PRESENT  [place]      Culture Urine  Specimen:  Urine - Urine, Clean Catch  Component 1 mo ago   SPECIMEN DESCRIPTION  Urine, Clean Catch    SPECIMEN DESCRIPTION  Tested at [place] [address]    SPECIMEN TYPE  Urine Reflexed from [place]  Tested at [place] [address]    CULTURE RESULTS  Positive Growth    CULTURE RESULTS  STAPHYLOCOCCUS SAPROPHYTICUS    MICRONOTE  (NOTE) >100000 CFU/mL Staphylococcus saprophyticus Staphylococcus saprophyticus typically responds to urine concentrations of nitrofurantoin, trimethoprim/sulfa, or a fluoroquinolone commonly used to treat acute, uncomplicated urinary tract infections No   further workup. Tested at: [facility], [address] [address]   REPORT  STATUS  08/19/2020 FINAL REPORT    Resulting Agency [place]   Specimen Collected: 08/17/20 9:10 AM     Pregnancy Test, Urine   Ref Range & Units 1 mo ago Comments   PREGNANCY TEST URINE NEGATIVE NEGATIVE  Tested at [facility]    Specimen Collected: 08/17/20 9:10 AM Last Resulted: 08/17/20 9:39 AM        On this date 09/24/2020 I have spent 35 minutes reviewing previous notes, test results and face to face with the patient discussing the diagnosis and importance of compliance with the treatment plan as well as documenting on the day of the visit.    An electronic signature was used to authenticate this note.    --Janese Banks, DO

## 2020-10-20 ENCOUNTER — Ambulatory Visit: Admit: 2020-10-20 | Discharge: 2020-10-20 | Payer: BLUE CROSS/BLUE SHIELD | Attending: Family | Primary: Family Medicine

## 2020-10-20 DIAGNOSIS — Z0000 Encounter for general adult medical examination without abnormal findings: Secondary | ICD-10-CM

## 2020-10-20 NOTE — Patient Instructions (Signed)
Patient Education        ??? ?????, 18 ???? 50 ???? ????: ??????? ???????????  Well Visit, Ages 19 to 14: Care Instructions  ????????? ?????  ??? ?????????? ???????? ?????? ??? ????? ???? ????? ??????? ???????????? ??????? ????? ????????? ???? ????????? ? ? ??????? ????? ????????? ??????? ????? ???? ????????? ?????? ????? ???????? ??? ????? ??????? ?????????? ??????? ?????? ??????? ????? ??? ???? ??????????? ????, ????? ???????? ???? ????, ?????? ??????? ????? ? ???? ????????? ???????????? ????? ????? ???? ???????????  ???-?? ?????? ??????? ????? ??? ????????? ????? ??? ??? ??? ????????????? ??? ??? ????? ??????? ????????? ???? ??? ???????? ????????? ??????? ? ??? ????? ????????? ?? ?????????? ????? ?????? ????????? ???? ????? ??? ????? ???? ????????? ???? ?????? ??? ?? ?????? ????? ???  ????? ???? ????? ??????? ???? ???? ???????????  ?? ????? ? ??????? ?????????? ?????? ????????? ????????? ?????????? ?????????? ????????? ???? ?????? ?????? ???????? ????????? ?? ?????? ????? ????? ????? ??????  ?? ???????? ???????? ????????? ?????, ??????, ??? ????? (????????? ????), ??????? ? ?? ???? ???? ?????? ??????????? ??????????? ???????? ??????????? ??????????? ????? (????) ?????????? ????? ?????? ??? ?? ??????????  ?? ????? ???? ?????????? ?? ?? ?????????? ????? ????? ?? ??? ????? 2 ????????? ?? ?????? 14 ?????????????? ??? ?????????? ????? ????? ?? ??? ????? 1 ??????? ?? ?????? 7 ?????????????? ??? ??????????  ?? ??????? ???????? ??????? 30 ??????? ??????? ?????????? ?????????? ??????? ?????? ????? ??? ????? ??????, ???? ??????, ????? ?????? ?? ????? ?????? ?? ??????? ?????? ?????? ????? ???? ????????????? ???? ??? ??????????? ????? ??????????? ??????? ??????????? ???? ???? ??? ?????????? ?????? ???? ??????????  ?? ???????? ?????? ????? ?????????? ? ????? ???? ??????????? ???? ???????, ??????, ???? ??? ? ???? ??????? ????? ???? ????????? ???? ??????? ??? ??????? ??  ?? ???????? ?????????? ?? ??????? ????? ?????? ???????? ?????  ?????? ?????????? ???????? ???????? ??????? ????? ???????, ????? ??????????? ????????-?????? ????????? ? ????????? ?????? ???? ?????????? ????????? ???????? ?? ?????? ????? ???? ???????? ???????? ??????? ????? ?????  ?? ????? ?????? ??????????? ????? ?????????? ?????? ? ???? ????? ??? ???? ????? ?? ????? ????? ???? ??? ????? ????????????? ???? ????? ? ????? ?????? ??? ???????? ????????? ????? ????????? ????? ??????????? ?????? ??????????? ????? ??????? ??:?? ?? ????? ????? ?????????? ??? ????? ??????????? ???? ?????????? ??????? ????? ????? ?????  ?? ??????? ?????? ????? ?????????? (STIs) ?? ??????? ??????? ??? ?? ????? ????? ?????? ????? ??????????? ???? ?????????? ????? ????? ??????? (?? ??????????) ??? ??? ??????? ????? ??????????? ????????? ? ??????? (?? ???) ?? STIs ??????? ?????????? ??? ???? STIs ????? ????? ????? ????? ????? ????? (????? ?? ??????? ?????) ?????? ?????????? ? ????? ????? ????? ??? ??????? ??? ??????? ?????????? ? ?????? ??? ???????? ????? ??? ??????? ?????????? ??? ???? ????? ?????? HPV ????? ???? STIs ?? ???? ?????? ?????? ????  ?? ???????? ???? ????????? ???? ?????? ? ??? ???? ?????? ?????? ?????????? ????? ??????????? ???????? ?????? ? ??????? ???? ?? ???????? ??? ???? ????? ?????? ???? ??????????  ?? ???????? ????? ????? ?? ?????????? ???? ????? ?????? ???? ? ????? ?????? ???? ????? ??????????? ???? ?????????? ???? ????????? ??????? (????? ????????????? ? ?????????) ? ???? ????????????? (????? ????? ? ??????????????) ???????? ??????? ?????????? ???????? ??? ???????? ????? ???????? ????? ????? ???? ????? ????? ????? ???? ???????????  ?? ????? ???? ????? ????? ???????? ??????? ?????????? ????? ??????? 10 ??????? ?????? 4 ??????? ????? ???????? ??????, ??????? ????????? ?? ?????? ? ???? ?????? ???? ?????? ??????????? UV ?????? ?????? ?? ????? ??????????? ???? ?????? ???? ???, ???? ??? ?????? ?????? ?????-?????????? ????????? (SPF 30 ?? ?????) ???????????  ?? ????? ???? ???? ????? ? ??? ???? ??????  ?? ?? ??? ??????? ???? ??????????? ????????????  ?? ????? ????? ??? ????? ???????????  ??????? ??????? ???? ????? ??? ??????????  ??????? ??????????? ???? ??????????? ???????? ??????? ????????? ? ???????? ?????? ?? ?????????? ?????? ???? ????? ??????????? ??????? ???? ??????? ??????????  ??????? ?? ???? ????? ???????????  ???? ????????  [email or web address]  ???????? ????????? P072 ?????? ?? ????? ??? ?????? "??? ?????, 18 ???? 50 ???? ????: ??????? ???????????."  ?? ????? ???: 2021 02 11??????????????????????????????????????? ?????: 13.0  ?? 2006-2021 [employer], Inc[employer].   ???????? ?????????????? ?????? ???????? ???????? ??????? ????????? ?????? ????? ??????? ??? ???????? ?? ???????? ?????? ?? ?? ?????????? ?????? ????????? ??? ???, ???? ????? ????????? ?????? ????? ????????? ??????????? [employer] Inc[employer], ?? ??????? ?? ????????? ???????? ???? ???? ??? ???????? ?? ?????????? ???????? ??????

## 2020-10-20 NOTE — Progress Notes (Signed)
Wendy Frank (DOB:  1980-12-12) is a 39 y.o. female,Established patient, here for evaluation of the following chief complaint(s):  Annual Exam [facility] )      ASSESSMENT/PLAN:  1. Annual visit for general adult medical examination without abnormal findings  -Normal exam today  -Fasting labs will be obtained at an outpatient [place] location  -Continue medications as ordered  -We will schedule Pap at a later date  -Will be due for diabetes follow-up in February  -     Comprehensive Metabolic Panel; Future  -     Lipid Panel; Future  2. Type 2 diabetes mellitus without complication, without long-term current use of insulin (HCC)  -Continue Metformin as ordered.  Will be due for repeat A1c in February.  3. Hypercholesterolemia  -Elevated in the past but managed with diet.  No medications pending results from labs ordered today.  -     Comprehensive Metabolic Panel; Future  -     Lipid Panel; Future  4. Vitamin D deficiency  -Has been deficient in the past.  Recheck.  -     Vitamin D 25 Hydroxy; Future      Return in about 2 months (around 12/26/2020) for Follow-up diabetes.    SUBJECTIVE/OBJECTIVE:  HPI  Wendy Frank presents today for annual physical.  Denies any complaints today.  Continues to take medications without issue.  Denies any flareups of reflux since last seen.  Allergies are usually controlled with Zyrtec.  Occasionally will have some cut through congestion, but this is infrequent.  She is currently working for [employer].  Works 12-hour shifts.  Very busy work.  This keeps her active.  She states that she has had most of her vaccinations when she initially came to the [place].  Did live in [place] for some time which would explain the lack of update with the hepatitis B vaccine.  She is agreeable to having a Pap smear scheduled at a later date.  She is not fasting for labs today but is agreeable to going to an outpatient [place] draw site.    Review of Systems   Constitutional:  Negative for chills and fever.   HENT: Negative for ear discharge, ear pain, hearing loss, sinus pressure, sinus pain and sore throat.    Eyes: Negative for pain, discharge and redness.   Respiratory: Negative for cough, shortness of breath and wheezing.    Cardiovascular: Negative for chest pain and palpitations.   Gastrointestinal: Negative for abdominal distention, abdominal pain, diarrhea, nausea and vomiting.   Genitourinary: Negative for dysuria and hematuria.   Musculoskeletal: Negative for myalgias.   Skin: Negative for rash.   Neurological: Negative for dizziness, weakness, light-headedness, numbness and headaches.   Psychiatric/Behavioral: Negative for decreased concentration, dysphoric mood and sleep disturbance. The patient is not nervous/anxious.        Physical Exam  Constitutional:       General: She is not in acute distress.     Appearance: Normal appearance. She is normal weight.   HENT:      Head: Normocephalic and atraumatic.      Right Ear: Tympanic membrane, ear canal and external ear normal.      Left Ear: Tympanic membrane, ear canal and external ear normal.      Mouth/Throat:      Mouth: Mucous membranes are moist.   Eyes:      Extraocular Movements: Extraocular movements intact.      Conjunctiva/sclera: Conjunctivae normal.  Pupils: Pupils are equal, round, and reactive to light.   Neck:      Thyroid: No thyromegaly.      Vascular: No carotid bruit.   Cardiovascular:      Rate and Rhythm: Normal rate and regular rhythm.      Pulses: Normal pulses.      Heart sounds: Normal heart sounds. No murmur heard.  No gallop.    Pulmonary:      Effort: Pulmonary effort is normal.      Breath sounds: Normal breath sounds. No wheezing.   Abdominal:      General: Bowel sounds are normal.      Palpations: Abdomen is soft.      Tenderness: There is no abdominal tenderness. There is no guarding or rebound.   Musculoskeletal:         General: Normal range of motion.      Cervical back: Normal range of  motion and neck supple.   Lymphadenopathy:      Cervical: No cervical adenopathy.   Skin:     General: Skin is warm and dry.      Capillary Refill: Capillary refill takes less than 2 seconds.   Neurological:      Mental Status: She is alert and oriented to person, place, and time.   Psychiatric:         Mood and Affect: Mood normal.         Behavior: Behavior normal.         Thought Content: Thought content normal.         Judgment: Judgment normal.               This dictation was generated by voice recognition computer software.  Although all attempts are made to edit the dictation for accuracy, there may be errors in the transcription that are not intended.    An electronic signature was used to authenticate this note.    --Mickel Crow, APRN - CNP

## 2020-11-08 ENCOUNTER — Encounter

## 2020-11-08 MED ORDER — METFORMIN HCL ER 500 MG PO TB24
500 MG | ORAL_TABLET | ORAL | 0 refills | Status: AC
Start: 2020-11-08 — End: ?

## 2020-11-08 NOTE — Telephone Encounter (Signed)
Component Ref Range & Units 09/24/20 1634 05/25/20 1425 02/24/20 1023 10/28/19 0830 02/03/19 0908 08/08/18 1600 02/08/18 1621   Hemoglobin A1C % 7.1 VC  6.6  6.4  6.5 R, CM  6.4  6.6  6.5    Resulting Agency

## 2020-11-08 NOTE — Telephone Encounter (Signed)
LAST VISIT 10/20/2020 TR, NEXT VISIT NONE.

## 2020-11-19 NOTE — Progress Notes (Signed)
MYCHART MESSAGE SENT TO PT, PT DUE FOR BLOOD WORK.  WDH

## 2020-12-10 LAB — LIPID PANEL
Cholesterol non HDL: 135
Cholesterol, Total: 192 mg/dL
HDL: 57 mg/dL (ref 35–70)
LDL Calculated: 104 mg/dL (ref 0–160)
Triglycerides: 157 mg/dL

## 2021-03-29 ENCOUNTER — Encounter

## 2021-03-29 MED ORDER — OMEPRAZOLE 20 MG PO CPDR
20 MG | ORAL_CAPSULE | ORAL | 0 refills | Status: AC
Start: 2021-03-29 — End: ?

## 2021-03-29 NOTE — Telephone Encounter (Signed)
LV 10/20/20 WITH TR FOR PHYSICAL NV NONE

## 2021-06-10 LAB — HEMOGLOBIN A1C: Hemoglobin A1C: 6.9 %

## 2021-07-04 ENCOUNTER — Emergency Department: Admit: 2021-07-04 | Payer: BLUE CROSS/BLUE SHIELD | Primary: Family Medicine

## 2021-07-04 ENCOUNTER — Inpatient Hospital Stay
Admit: 2021-07-04 | Discharge: 2021-07-04 | Disposition: A | Discharge status: Home / Self Care | Payer: BLUE CROSS/BLUE SHIELD | Attending: Emergency Medicine

## 2021-07-04 DIAGNOSIS — F411 Generalized anxiety disorder: Secondary | ICD-10-CM

## 2021-07-04 LAB — BASIC METABOLIC PANEL
Anion Gap: 13 (ref 3–16)
BUN: 15 mg/dL (ref 7–20)
CO2: 18 mmol/L — ABNORMAL LOW (ref 21–32)
Calcium: 9.1 mg/dL (ref 8.3–10.6)
Chloride: 103 mmol/L (ref 99–110)
Creatinine: 0.6 mg/dL (ref 0.6–1.1)
GFR African American: >60 (ref >60)
GFR Non-African American: >60 (ref >60)
Glucose: 178 mg/dL — ABNORMAL HIGH (ref 70–99)
Potassium: 4 mmol/L (ref 3.5–5.1)
Sodium: 134 mmol/L — ABNORMAL LOW (ref 136–145)

## 2021-07-04 LAB — TROPONIN: Troponin: <0.01 ng/mL (ref <0.01)

## 2021-07-04 LAB — HCG, QUANTITATIVE, PREGNANCY: hCG Quant: <5 m[IU]/mL (ref <5.0)

## 2021-07-04 LAB — D-DIMER, QUANTITATIVE: D-Dimer, Quant: <0.27 ug/mL FEU (ref 0.00–0.60)

## 2021-07-04 MED ORDER — LORAZEPAM 1 MG PO TABS
1 MG | Freq: Once | ORAL | Status: AC
Start: 2021-07-04 — End: 2021-07-04
  Administered 2021-07-04: 15:00:00 2 mg via ORAL

## 2021-07-04 MED ORDER — LORAZEPAM 1 MG PO TABS
1 MG | ORAL_TABLET | Freq: Four times a day (QID) | ORAL | 0 refills | Status: AC | PRN
Start: 2021-07-04 — End: 2021-08-03

## 2021-07-04 MED FILL — LORAZEPAM 1 MG PO TABS: 1 mg | ORAL | Qty: 2

## 2021-07-04 NOTE — ED Provider Notes (Signed)
Emergency Department Encounter    Patient: Wendy Frank  MRN: [id number]  DOB: Mar 11, 1981  Date of Evaluation: 07/06/2021  ED Provider:  Joslyn Hy, MD    Triage Chief Complaint:   Shortness of Breath (Pt brought in per [facility] from work, pt reports it is hot at work and she became sob, denies pain, states she feels panic.)    HOPI:  Wendy Frank is a 40 y.o. female that presents to the ER for evaluation of positive dyspnea anxiety tearful.  Was working.  Emotionally labile.  Not hypoxic.  Not tachycardic.  Not pregnant.  Type II diabetic.  Afebrile.  No active symptoms now.    ROS - see HPI, below listed is current ROS at time of my eval:  General:  No fevers, no weakness  Eyes:  no discharge  ENT:  No sore throat, no nasal congestion  Cardiovascular:  + chest pain, no palpitations  Respiratory:  No shortness of breath, no cough, no wheezing  Gastrointestinal:  No pain, no nausea, no vomiting, no diarrhea  Musculoskeletal:  No muscle pain, no joint pain  Skin:  No rash, no pruritis  Neurologic:  No speech problems, no headache, no extremity numbness, no extremity tingling, no extremity weakness  Psychiatric:  No anxiety  Genitourinary:  No dysuria, no hematuria  Endocrine:  No unexpected weight gain, no unexpected weight loss  Extremities:  no edema, no pain    Past Medical History:   Diagnosis Date    Gastroesophageal reflux disease 06/06/2020    History of Bacillus Calmette-Guerin vaccination     Hypercholesterolemia 02/02/2019    Overweight with body mass index (BMI) of 27 to 27.9 in adult 02/02/2019    Plantar fasciitis, bilateral 02/02/2019    Tuberculosis exposure     father    Type 2 diabetes mellitus without complication (HCC)     Vitamin D deficiency 08/23/2017     Ref. Range 08/23/2017 10:06 Vit D, 25-Hydroxy >=30 ng/mL 19.1 (L)      Past Surgical History:   Procedure Laterality Date    CESAREAN SECTION N/A 05/09/2010    CESAREAN SECTION N/A 11/11/2014    CHOLECYSTECTOMY,  LAPAROSCOPIC Right 2012    TUBAL LIGATION Bilateral 11/11/2014     Family History   Problem Relation Age of Onset    Diabetes Mother         oral meds,     Tuberculosis Father         txed    High Cholesterol Father     No Known Problems Sister     No Known Problems Sister     No Known Problems Sister     No Known Problems Brother     No Known Problems Brother      Social History     Socioeconomic History    Marital status: Married     Spouse name: Kul    Number of children: 2    Years of education: 12    Highest education level: High school graduate   Occupational History    Occupation: Catering manager work in Warehouse manager: [employer]     Comment: 40 hr (shut done for COVID-25 Mar 2019)    Occupation: Chief Financial Officer things.     Comment: 40 hr/wk   Tobacco Use    Smoking status: Never    Smokeless tobacco: Never   Vaping Use    Vaping Use: Never used   Substance and  Sexual Activity    Alcohol use: No    Drug use: Never    Sexual activity: Yes     Partners: Male     Comment: married   Other Topics Concern    Not on file   Social History Narrative    [place] - arrived 2010 [place].  Before COVID-19 walked 30 min qd. 02/03/19. Walk 20 min outside and 15-20 min on treadmill every day.05/25/20. Walks 30 min/day on treadmill.09/24/20.     Social Determinants of Health     Financial Resource Strain: Not on file   Food Insecurity: Not on file   Transportation Needs: Not on file   Physical Activity: Not on file   Stress: Not on file   Social Connections: Not on file   Intimate Partner Violence: Not on file   Housing Stability: Not on file     No current facility-administered medications for this encounter.     Current Outpatient Medications   Medication Sig Dispense Refill    LORazepam (ATIVAN) 1 MG tablet Take 1 tablet by mouth every 6 hours as needed for Anxiety for up to 30 days. 10 tablet 0    omeprazole (PRILOSEC) 20 MG delayed release capsule TAKE 1 CAPSULE BY MOUTH TWICE A DAY (Patient not taking: Reported  on 07/04/2021) 180 capsule 0    metFORMIN (GLUCOPHAGE-XR) 500 MG extended release tablet TAKE 1 TABLET BY MOUTH TWICE A DAY 180 tablet 0    cetirizine (ZYRTEC) 10 MG tablet Take 10 mg by mouth daily       No Known Allergies    Nursing Notes Reviewed    Physical Exam:  Triage VS:    ED Triage Vitals   Enc Vitals Group      BP 07/04/21 1016 109/84      Heart Rate 07/04/21 1016 86      Resp 07/04/21 1016 17      Temp 07/04/21 1016 96.8 ??F (36 ??C)      Temp Source 07/04/21 1016 Oral      SpO2 07/04/21 1016 98 %      Weight 07/04/21 1012 141 lb (64 kg)      Height 07/04/21 1012 5\' 1"  (1.549 m)      Head Circumference --       Peak Flow --       Pain Score --       Pain Loc --       Pain Edu? --       Excl. in GC? --        My pulse ox interpretation is - normal    General appearance:  No acute distress.   Skin:  Warm. Dry.   Eye:  Extraocular movements intact.     Ears, nose, mouth and throat:  Oral mucosa moist   Neck:  Trachea midline.   Extremity:  No swelling.  Normal ROM     Heart:  Regular rate and rhythm, normal S1 & S2, no extra heart sounds.    Perfusion:  intact  Respiratory:  Lungs clear to auscultation bilaterally.  Respirations nonlabored.     Abdominal:  Normal bowel sounds.  Soft.  Nontender.  Non distended.   Neurological:  Alert and oriented times 3.             Psychiatric: Anxious, tearful    I have reviewed and interpreted all of the currently available lab results from this visit (if applicable):  Results for orders placed or performed  during the hospital encounter of 07/04/21   D-Dimer, Quantitative   Result Value Ref Range    D-Dimer, Quant <0.27 0.00 - 0.60 ug/mL FEU   Troponin   Result Value Ref Range    Troponin <0.01 <0.01 ng/mL   Basic Metabolic Panel   Result Value Ref Range    Sodium 134 (L) 136 - 145 mmol/L    Potassium 4.0 3.5 - 5.1 mmol/L    Chloride 103 99 - 110 mmol/L    CO2 18 (L) 21 - 32 mmol/L    Anion Gap 13 3 - 16    Glucose 178 (H) 70 - 99 mg/dL    BUN 15 7 - 20 mg/dL    Creatinine  0.6 0.6 - 1.1 mg/dL    GFR Non-African American >60 >60    GFR African American >60 >60    Calcium 9.1 8.3 - 10.6 mg/dL   hCG, quantitative, pregnancy   Result Value Ref Range    hCG Quant <5.0 <5.0 mIU/mL      Radiographs (if obtained):  Radiologist's Report Reviewed:  XR CHEST PORTABLE    Result Date: 07/04/2021  EXAMINATION: ONE XRAY VIEW OF THE CHEST 07/04/2021 10:24 am COMPARISON: None. HISTORY: ORDERING SYSTEM PROVIDED HISTORY: CP TECHNOLOGIST PROVIDED HISTORY: Reason for exam:->CP Reason for Exam: sob FINDINGS: The heart and pulmonary vascularity are within normal limits.  There are no focal areas of consolidation or pleural effusion. The osseous structures are intact.     No acute abnormality         EKG (if obtained): (All EKG's are interpreted by myself in the absence of a cardiologist)  Sinus rhythm normal axis, normal intervals, no acute ST segment abnormalities    MDM:  Patient presented with chest pain.  Given history and presentation cardiac workup initiated.  Patient had labs, EKG, x-ray and troponin ordered.  Patient was placed on monitor.  Patient's pulse ox is normal.      HEART Score:   2    Patient's chest x-ray is read by radiology as negative for acute abnormality    The patient's initial troponin is within the normal range.  Patient's EKG did not show any acute diagnostic ischemic changes.  The patient was given medications, is starting to feel better.  Patient does not have any acute hemodynamic instability.    I completed a HEART PATHWAY to screen for Acute Coronary Syndrome (ACS) in this patient. The evidence indicates that the patient is very low risk for ACS and this is consistent with my clinical intuition. The risk of further workup or hospitalization is likely higher than the risk of the patient having an ACS. It is, therefore, in the patient???s best interest not to do additional emergent testing or be hospitalized.  Full acute anxiety reaction complete resolution after Ativan normal  D-dimer normal troponin, stable for outpatient management.    Clinical Impression:  1. Dyspnea, unspecified type    2. Anxiety reaction      Disposition referral (if applicable):  Rigoberto Noel, MD  [address]  [place] [address]  [phone]        Disposition medications (if applicable):  Discharge Medication List as of 07/04/2021 12:24 PM        START taking these medications    Details   LORazepam (ATIVAN) 1 MG tablet Take 1 tablet by mouth every 6 hours as needed for Anxiety for up to 30 days., Disp-10 tablet, R-0Print  Comment: Please note this report has been produced using speech recognition software and may contain errors related to that system including errors in grammar, punctuation, and spelling, as well as words and phrases that may be inappropriate.  Efforts were made to edit the dictations.       Joslyn HyMichael Chelci [place], MD  07/06/21 1348

## 2021-07-04 NOTE — ED Notes (Signed)
Pt in bed, husband at bedside, updated on plan of care     Remus Loffler, RN  07/04/21 1050

## 2021-10-07 LAB — HM DIABETES EYE EXAM: Diabetic Retinopathy: NEGATIVE

## 2021-12-03 LAB — HEMOGLOBIN A1C
AVERAGE GLUCOSE: 171
Hemoglobin A1C: 7.6 %

## 2021-12-03 LAB — LIPID PANEL
Cholesterol non HDL: 132
Cholesterol, Total: 202 mg/dL — ABNORMAL HIGH
HDL: 70 mg/dL (ref 35–70)
LDL Calculated: 111 mg/dL (ref 0–160)
Triglycerides: 106 mg/dL

## 2021-12-03 LAB — MICROALBUMIN / CREATININE URINE RATIO
Creatinine, Urine: 65
Microalb, Ur: <12
